# Patient Record
Sex: Female | Born: 1990 | Race: White | Hispanic: No | Marital: Single | Smoking: Former smoker
Health system: Southern US, Community
[De-identification: ages and names within clinical notes are randomized; demographics above are authoritative.]

## PROBLEM LIST (undated history)

## (undated) ENCOUNTER — Inpatient Hospital Stay (HOSPITAL_COMMUNITY): Payer: Self-pay

## (undated) DIAGNOSIS — F32A Depression, unspecified: Secondary | ICD-10-CM

## (undated) DIAGNOSIS — F419 Anxiety disorder, unspecified: Secondary | ICD-10-CM

## (undated) DIAGNOSIS — F329 Major depressive disorder, single episode, unspecified: Secondary | ICD-10-CM

## (undated) HISTORY — PX: EYE SURGERY: SHX253

## (undated) HISTORY — PX: TONSILLECTOMY: SUR1361

---
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
## 2012-05-30 ENCOUNTER — Emergency Department (HOSPITAL_COMMUNITY)
Admission: EM | Admit: 2012-05-30 | Discharge: 2012-05-30 | Disposition: A | Discharge status: Home / Self Care | Payer: 59 | Attending: Emergency Medicine | Admitting: Emergency Medicine

## 2012-05-30 ENCOUNTER — Emergency Department (HOSPITAL_COMMUNITY): Payer: 59

## 2012-05-30 ENCOUNTER — Encounter (HOSPITAL_COMMUNITY): Payer: Self-pay | Admitting: *Deleted

## 2012-05-30 DIAGNOSIS — S0990XA Unspecified injury of head, initial encounter: Secondary | ICD-10-CM

## 2012-05-30 DIAGNOSIS — W010XXA Fall on same level from slipping, tripping and stumbling without subsequent striking against object, initial encounter: Secondary | ICD-10-CM | POA: Insufficient documentation

## 2012-05-30 MED ORDER — TRAMADOL HCL 50 MG PO TABS: 50.0000 mg | ORAL_TABLET | Freq: Four times a day (QID) | ORAL | Status: DC | PRN

## 2012-05-30 NOTE — ED Provider Notes (Signed)
History     CSN: [id number]  Arrival date & time 05/30/12  0146   First MD Initiated Contact with Patient 05/30/12 0203      Chief Complaint  Patient presents with  . Fall    (Consider location/radiation/quality/duration/timing/severity/associated sxs/prior treatment) Patient is a 21 y.o. female presenting with fall. The history is provided by the patient. No language interpreter was used.  Fall The accident occurred less than 1 hour ago. The fall occurred while walking. She fell from a height of 1 to 2 ft. She landed on a hard floor. There was no blood loss. The point of impact was the head. The pain is present in the head. The pain is moderate. She was ambulatory at the scene. There was no drug use involved in the accident. There was no alcohol use involved in the accident. Pertinent negatives include no visual change, no fever, no nausea and no headaches. The symptoms are aggravated by activity. She has tried nothing for the symptoms. The treatment provided no relief.    History reviewed. No pertinent past medical history.  History reviewed. No pertinent past surgical history.  No family history on file.  History  Substance Use Topics  . Smoking status: Current Every Day Smoker  . Smokeless tobacco: Not on file  . Alcohol Use: No    OB History    Grav Para Term Preterm Abortions TAB SAB Ect Mult Living                  Review of Systems  Constitutional: Negative for fever.  Gastrointestinal: Negative for nausea.  Neurological: Negative for seizures, facial asymmetry, speech difficulty, weakness, light-headedness and headaches.  All other systems reviewed and are negative.    Allergies  Review of patient's allergies indicates not on file.  Home Medications  No current outpatient prescriptions on file.  BP 108/79  Pulse 100  Temp 98.6 F (37 C) (Oral)  Resp 18  SpO2 99%  Physical Exam  Constitutional: She is oriented to person, place, and time. She  appears well-developed and well-nourished. No distress.  HENT:  Head: Normocephalic and atraumatic.  Right Ear: No hemotympanum.  Left Ear: No hemotympanum.  Eyes: Pupils are equal, round, and reactive to light.  Neck: Normal range of motion. Neck supple.       No c spine tenderness  Cardiovascular: Normal rate and regular rhythm.   Pulmonary/Chest: Effort normal and breath sounds normal. She has no wheezes. She has no rales.  Abdominal: Soft. Bowel sounds are normal. There is no tenderness. There is no rebound and no guarding.  Musculoskeletal: Normal range of motion. She exhibits no tenderness.  Neurological: She is alert and oriented to person, place, and time. She has normal reflexes.  Skin: Skin is warm and dry.  Psychiatric: She has a normal mood and affect.    ED Course  Procedures (including critical care time)  Labs Reviewed - No data to display No results found.   No diagnosis found.    MDM  Follow up with your doctor for ongoing care        Dua Mehler K Khandi Kernes-Rasch, MD 05/30/12 [phone]
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
## 2016-07-09 ENCOUNTER — Ambulatory Visit (HOSPITAL_COMMUNITY)
Admission: EM | Admit: 2016-07-09 | Discharge: 2016-07-09 | Disposition: A | Discharge status: Home / Self Care | Payer: No Typology Code available for payment source | Source: Ambulatory Visit | Attending: Emergency Medicine | Admitting: Emergency Medicine

## 2016-07-09 ENCOUNTER — Emergency Department (HOSPITAL_COMMUNITY)
Admission: EM | Admit: 2016-07-09 | Discharge: 2016-07-10 | Disposition: A | Discharge status: Home / Self Care | Payer: 59 | Attending: Emergency Medicine | Admitting: Emergency Medicine

## 2016-07-09 ENCOUNTER — Encounter (HOSPITAL_COMMUNITY): Payer: Self-pay | Admitting: *Deleted

## 2016-07-09 DIAGNOSIS — F17200 Nicotine dependence, unspecified, uncomplicated: Secondary | ICD-10-CM | POA: Insufficient documentation

## 2016-07-09 DIAGNOSIS — T7421XA Adult sexual abuse, confirmed, initial encounter: Secondary | ICD-10-CM | POA: Diagnosis not present

## 2016-07-09 DIAGNOSIS — Z0441 Encounter for examination and observation following alleged adult rape: Secondary | ICD-10-CM | POA: Insufficient documentation

## 2016-07-09 LAB — POC URINE PREG, ED: PREG TEST UR: NEGATIVE

## 2016-07-09 MED ORDER — IBUPROFEN 200 MG PO TABS
600.0000 mg | ORAL_TABLET | Freq: Once | ORAL | Status: AC
  Administered 2016-07-09: 600 mg via ORAL
  Filled 2016-07-09: qty 1
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
## 2017-03-31 LAB — OB RESULTS CONSOLE ANTIBODY SCREEN: Antibody Screen: NEGATIVE

## 2017-03-31 LAB — OB RESULTS CONSOLE ABO/RH: RH TYPE: POSITIVE

## 2017-03-31 LAB — OB RESULTS CONSOLE RPR: RPR: NONREACTIVE

## 2017-03-31 LAB — OB RESULTS CONSOLE HEPATITIS B SURFACE ANTIGEN: HEP B S AG: NEGATIVE

## 2017-03-31 LAB — OB RESULTS CONSOLE RUBELLA ANTIBODY, IGM: Rubella: IMMUNE

## 2017-03-31 LAB — OB RESULTS CONSOLE HIV ANTIBODY (ROUTINE TESTING): HIV: NONREACTIVE

## 2017-04-22 LAB — OB RESULTS CONSOLE GC/CHLAMYDIA
CHLAMYDIA, DNA PROBE: NEGATIVE
Gonorrhea: NEGATIVE

## 2017-07-30 ENCOUNTER — Inpatient Hospital Stay (HOSPITAL_COMMUNITY)
Admission: AD | Admit: 2017-07-30 | Discharge: 2017-07-30 | Disposition: A | Discharge status: Home / Self Care | Payer: 59 | Source: Ambulatory Visit | Attending: Obstetrics & Gynecology | Admitting: Obstetrics & Gynecology

## 2017-07-30 ENCOUNTER — Encounter (HOSPITAL_COMMUNITY): Payer: Self-pay | Admitting: *Deleted

## 2017-07-30 DIAGNOSIS — Z3A25 25 weeks gestation of pregnancy: Secondary | ICD-10-CM

## 2017-07-30 DIAGNOSIS — O26892 Other specified pregnancy related conditions, second trimester: Secondary | ICD-10-CM | POA: Insufficient documentation

## 2017-07-30 DIAGNOSIS — Z87891 Personal history of nicotine dependence: Secondary | ICD-10-CM | POA: Diagnosis not present

## 2017-07-30 DIAGNOSIS — O9A212 Injury, poisoning and certain other consequences of external causes complicating pregnancy, second trimester: Secondary | ICD-10-CM | POA: Diagnosis not present

## 2017-07-30 DIAGNOSIS — T1490XA Injury, unspecified, initial encounter: Secondary | ICD-10-CM

## 2017-07-30 DIAGNOSIS — Z3689 Encounter for other specified antenatal screening: Secondary | ICD-10-CM

## 2017-07-30 DIAGNOSIS — W109XXA Fall (on) (from) unspecified stairs and steps, initial encounter: Secondary | ICD-10-CM

## 2017-07-30 DIAGNOSIS — W19XXXA Unspecified fall, initial encounter: Secondary | ICD-10-CM

## 2017-07-30 NOTE — MAU Note (Signed)
Pt reports she was walking up some stairs at school and she caught her foot and fell a forward and hit her stomach and slid down about 3 stairs. Not c/o pain at this time but OB sent her in to be evaluated.

## 2017-07-30 NOTE — MAU Provider Note (Signed)
History     CSN: [id number]  Arrival date and time: 07/30/17 1136   First Provider Initiated Contact with Patient 07/30/17 1220      Chief Complaint  Patient presents with  . Fall   G1 @25 .2 weeks here after fall. She reports falling while climbing metal stairs when her foot caught the edge. She then fell forward and the stair making contact and then she slid down a few stairs on her abdomen. Denies pain at the site. Reports good FM. No VB, LOF, or abdominal pain.   OB History    Gravida Para Term Preterm AB Living   1             SAB TAB Ectopic Multiple Live Births                  Past Medical History:  Diagnosis Date  . Medical history non-contributory     Past Surgical History:  Procedure Laterality Date  . EYE SURGERY    . TONSILLECTOMY      No family history on file.  Social History   Tobacco Use  . Smoking status: Former Games developermoker  . Smokeless tobacco: Never Used  Substance Use Topics  . Alcohol use: No  . Drug use: No    Allergies: No Known Allergies  Medications Prior to Admission  Medication Sig Dispense Refill Last Dose  . acetaminophen (TYLENOL) 500 MG tablet Take 1,000 mg by mouth every 6 (six) hours as needed for mild pain.   Past Week at Unknown time  . Prenatal Vit-Fe Fumarate-FA (PRENATAL MULTIVITAMIN) TABS tablet Take 1 tablet by mouth daily at 12 noon.   07/29/2017 at Unknown time  . sertraline (ZOLOFT) 50 MG tablet Take 50 mg by mouth daily.   07/30/2017 at Unknown time    Review of Systems  Gastrointestinal: Negative for abdominal pain.  Genitourinary: Negative for vaginal bleeding.   Physical Exam   Blood pressure 112/64, pulse 85, temperature 97.6 F (36.4 C), temperature source Oral, resp. rate 16.  Physical Exam  Nursing note and vitals reviewed. Constitutional: She is oriented to person, place, and time. She appears well-developed and well-nourished. No distress.  HENT:  Head: Normocephalic and atraumatic.  Neck: Normal  range of motion.  Cardiovascular: Normal rate.  Respiratory: Effort normal. No respiratory distress.  GI: Soft. She exhibits no distension. There is no tenderness.  gravid  Musculoskeletal: Normal range of motion.  Neurological: She is alert and oriented to person, place, and time.  Skin: Skin is warm and dry.  Psychiatric: She has a normal mood and affect.  EFM: 150 bpm, mod variability, + accels, no decels Toco: none  No results found for this or any previous visit (from the past 24 hour(s)).  MAU Course  Procedures Prolonged EFM  MDM Pt without pain or VB. FHT reassuring. Presentation, clinical findings, and plan discussed with Dr. Dion BodyVarnado and Dr. Charlotta Newtonzan. Stable for discharge home.  Assessment and Plan   1. [redacted] weeks gestation of pregnancy   2. NST (non-stress test) reactive   3. Fall, initial encounter    Discharge home Follow [facility] as scheduled PTL/abruption precautions  Allergies as of 07/30/2017   No Known Allergies     Medication List    TAKE these medications   acetaminophen 500 MG tablet Commonly known as:  TYLENOL Take 1,000 mg by mouth every 6 (six) hours as needed for mild pain.   prenatal multivitamin Tabs tablet Take 1 tablet by  mouth daily at 12 noon.   sertraline 50 MG tablet Commonly known as:  ZOLOFT Take 50 mg by mouth daily.      Donette LarryMelanie Jianni Shelden, CNM 07/30/2017, 12:29 PM

## 2017-07-30 NOTE — Discharge Instructions (Signed)
Braxton Hicks Contractions °Contractions of the uterus can occur throughout pregnancy, but they are not always a sign that you are in labor. You may have practice contractions called Braxton Hicks contractions. These false labor contractions are sometimes confused with true labor. °What are Braxton Hicks contractions? °Braxton Hicks contractions are tightening movements that occur in the muscles of the uterus before labor. Unlike true labor contractions, these contractions do not result in opening (dilation) and thinning of the cervix. Toward the end of pregnancy (32-34 weeks), Braxton Hicks contractions can happen more often and may become stronger. These contractions are sometimes difficult to tell apart from true labor because they can be very uncomfortable. You should not feel embarrassed if you go to the hospital with false labor. °Sometimes, the only way to tell if you are in true labor is for your health care provider to look for changes in the cervix. The health care provider will do a physical exam and may monitor your contractions. If you are not in true labor, the exam should show that your cervix is not dilating and your water has not broken. °If there are no prenatal problems or other health problems associated with your pregnancy, it is completely safe for you to be sent home with false labor. You may continue to have Braxton Hicks contractions until you go into true labor. °How can I tell the difference between true labor and false labor? °· Differences °? False labor °? Contractions last 30-70 seconds.: Contractions are usually shorter and not as strong as true labor contractions. °? Contractions become very regular.: Contractions are usually irregular. °? Discomfort is usually felt in the top of the uterus, and it spreads to the lower abdomen and low back.: Contractions are often felt in the front of the lower abdomen and in the groin. °? Contractions do not go away with walking.: Contractions may  go away when you walk around or change positions while lying down. °? Contractions usually become more intense and increase in frequency.: Contractions get weaker and are shorter-lasting as time goes on. °? The cervix dilates and gets thinner.: The cervix usually does not dilate or become thin. °Follow these instructions at home: °· Take over-the-counter and prescription medicines only as told by your health care provider. °· Keep up with your usual exercises and follow other instructions from your health care provider. °· Eat and drink lightly if you think you are going into labor. °· If Braxton Hicks contractions are making you uncomfortable: °? Change your position from lying down or resting to walking, or change from walking to resting. °? Sit and rest in a tub of warm water. °? Drink enough fluid to keep your urine clear or pale yellow. Dehydration may cause these contractions. °? Do slow and deep breathing several times an hour. °· Keep all follow-up prenatal visits as told by your health care provider. This is important. °Contact a health care provider if: °· You have a fever. °· You have continuous pain in your abdomen. °Get help right away if: °· Your contractions become stronger, more regular, and closer together. °· You have fluid leaking or gushing from your vagina. °· You pass blood-tinged mucus (bloody show). °· You have bleeding from your vagina. °· You have low back pain that you never had before. °· You feel your baby’s head pushing down and causing pelvic pressure. °· Your baby is not moving inside you as much as it used to. °Summary °· Contractions that occur before labor are   called Braxton Hicks contractions, false labor, or practice contractions.  Braxton Hicks contractions are usually shorter, weaker, farther apart, and less regular than true labor contractions. True labor contractions usually become progressively stronger and regular and they become more frequent.  Manage discomfort from  [facility] by changing position, resting in a warm bath, drinking plenty of water, or practicing deep breathing. This information is not intended to replace advice given to you by your health care provider. Make sure you discuss any questions you have with your health care provider. Document Released: 08/11/2005 Document Revised: 06/30/2016 Document Reviewed: 06/30/2016 Elsevier Interactive Patient Education  2017 [employer] Fall Prevention in the Home Falls can cause injuries and can affect people from all age groups. There are many simple things that you can do to make your home safe and to help prevent falls. What can I do on the outside of my home?  Regularly repair the edges of walkways and driveways and fix any cracks.  Remove high doorway thresholds.  Trim any shrubbery on the main path into your home.  Use bright outdoor lighting.  Clear walkways of debris and clutter, including tools and rocks.  Regularly check that handrails are securely fastened and in good repair. Both sides of any steps should have handrails.  Install guardrails along the edges of any raised decks or porches.  Have leaves, snow, and ice cleared regularly.  Use sand or salt on walkways during winter months.  In the garage, clean up any spills right away, including grease or oil spills. What can I do in the bathroom?  Use night lights.  Install grab bars by the toilet and in the tub and shower. Do not use towel bars as grab bars.  Use non-skid mats or decals on the floor of the tub or shower.  If you need to sit down while you are in the shower, use a plastic, non-slip stool.  Keep the floor dry. Immediately clean up any water that spills on the floor.  Remove soap buildup in the tub or shower on a regular basis.  Attach bath mats securely with double-sided non-slip rug tape.  Remove throw rugs and other tripping hazards from the floor. What can I do in the bedroom?  Use  night lights.  Make sure that a bedside light is easy to reach.  Do not use oversized bedding that drapes onto the floor.  Have a firm chair that has side arms to use for getting dressed.  Remove throw rugs and other tripping hazards from the floor. What can I do in the kitchen?  Clean up any spills right away.  Avoid walking on wet floors.  Place frequently used items in easy-to-reach places.  If you need to reach for something above you, use a sturdy step stool that has a grab bar.  Keep electrical cables out of the way.  Do not use floor polish or wax that makes floors slippery. If you have to use wax, make sure that it is non-skid floor wax.  Remove throw rugs and other tripping hazards from the floor. What can I do in the stairways?  Do not leave any items on the stairs.  Make sure that there are handrails on both sides of the stairs. Fix handrails that are broken or loose. Make sure that handrails are as long as the stairways.  Check any carpeting to make sure that it is firmly attached to the stairs. Fix any carpet that is loose or worn.  Avoid having throw rugs at the top or bottom of stairways, or secure the rugs with carpet tape to prevent them from moving.  Make sure that you have a light switch at the top of the stairs and the bottom of the stairs. If you do not have them, have them installed. What are some other fall prevention tips?  Wear closed-toe shoes that fit well and support your feet. Wear shoes that have rubber soles or low heels.  When you use a stepladder, make sure that it is completely opened and that the sides are firmly locked. Have someone hold the ladder while you are using it. Do not climb a closed stepladder.  Add color or contrast paint or tape to grab bars and handrails in your home. Place contrasting color strips on the first and last steps.  Use mobility aids as needed, such as canes, walkers, scooters, and crutches.  Turn on lights if  it is dark. Replace any light bulbs that burn out.  Set up furniture so that there are clear paths. Keep the furniture in the same spot.  Fix any uneven floor surfaces.  Choose a carpet design that does not hide the edge of steps of a stairway.  Be aware of any and all pets.  Review your medicines with your healthcare provider. Some medicines can cause dizziness or changes in blood pressure, which increase your risk of falling. Talk with your health care provider about other ways that you can decrease your risk of falls. This may include working with a physical therapist or trainer to improve your strength, balance, and endurance. This information is not intended to replace advice given to you by your health care provider. Make sure you discuss any questions you have with your health care provider. Document Released: 08/01/2002 Document Revised: 01/08/2016 Document Reviewed: 09/15/2014 Elsevier Interactive Patient Education  2017 [employer]

## 2017-07-30 NOTE — MAU Note (Signed)
Urine in lab 

## 2017-08-25 NOTE — L&D Delivery Note (Signed)
Delivery Note At  a viable female was delivered via  (Presentation:OA ;  ).  APGAR:8 9, ; weight  .   Placenta status:complete , .  Cord:3V  with the following complications CAN x1 nuchal hand .    Anesthesia:  Epidural Episiotomy:  None Lacerations:  left periurtheral Suture Repair: 4-0 vicryl Est. Blood Loss (mL):  150cc  Mom to postpartum.  Baby to Couplet care / Skin to Skin.  Henderson Newcomerancy Jean Katira Dumais 10/31/2017, 2:15 AM

## 2017-10-15 LAB — OB RESULTS CONSOLE GBS: GBS: NEGATIVE

## 2017-10-29 ENCOUNTER — Inpatient Hospital Stay (HOSPITAL_COMMUNITY)
Admission: AD | Admit: 2017-10-29 | Discharge: 2017-11-01 | DRG: 807 | Disposition: A | Discharge status: Home / Self Care | Payer: 59 | Source: Ambulatory Visit | Attending: Obstetrics & Gynecology | Admitting: Obstetrics & Gynecology

## 2017-10-29 ENCOUNTER — Encounter (HOSPITAL_COMMUNITY): Payer: Self-pay | Admitting: *Deleted

## 2017-10-29 DIAGNOSIS — O365930 Maternal care for other known or suspected poor fetal growth, third trimester, not applicable or unspecified: Secondary | ICD-10-CM | POA: Diagnosis present

## 2017-10-29 DIAGNOSIS — O9902 Anemia complicating childbirth: Secondary | ICD-10-CM | POA: Diagnosis present

## 2017-10-29 DIAGNOSIS — Z87891 Personal history of nicotine dependence: Secondary | ICD-10-CM | POA: Diagnosis not present

## 2017-10-29 DIAGNOSIS — O134 Gestational [pregnancy-induced] hypertension without significant proteinuria, complicating childbirth: Principal | ICD-10-CM | POA: Diagnosis present

## 2017-10-29 DIAGNOSIS — Z3A38 38 weeks gestation of pregnancy: Secondary | ICD-10-CM | POA: Diagnosis not present

## 2017-10-29 DIAGNOSIS — D649 Anemia, unspecified: Secondary | ICD-10-CM | POA: Diagnosis present

## 2017-10-29 DIAGNOSIS — O139 Gestational [pregnancy-induced] hypertension without significant proteinuria, unspecified trimester: Secondary | ICD-10-CM | POA: Diagnosis present

## 2017-10-29 DIAGNOSIS — O133 Gestational [pregnancy-induced] hypertension without significant proteinuria, third trimester: Secondary | ICD-10-CM

## 2017-10-29 HISTORY — DX: Major depressive disorder, single episode, unspecified: F32.9

## 2017-10-29 HISTORY — DX: Anxiety disorder, unspecified: F41.9

## 2017-10-29 HISTORY — DX: Depression, unspecified: F32.A

## 2017-10-29 LAB — COMPREHENSIVE METABOLIC PANEL
ALT: 11 U/L — ABNORMAL LOW (ref 14–54)
AST: 19 U/L (ref 15–41)
Albumin: 3.1 g/dL — ABNORMAL LOW (ref 3.5–5.0)
Alkaline Phosphatase: 207 U/L — ABNORMAL HIGH (ref 38–126)
Anion gap: 9 (ref 5–15)
BUN: 10 mg/dL (ref 6–20)
CO2: 20 mmol/L — ABNORMAL LOW (ref 22–32)
Calcium: 9.1 mg/dL (ref 8.9–10.3)
Chloride: 104 mmol/L (ref 101–111)
Creatinine, Ser: 0.63 mg/dL (ref 0.44–1.00)
GFR calc Af Amer: >60 mL/min (ref >60)
GFR calc non Af Amer: >60 mL/min (ref >60)
Glucose, Bld: 78 mg/dL (ref 65–99)
Potassium: 4 mmol/L (ref 3.5–5.1)
Sodium: 133 mmol/L — ABNORMAL LOW (ref 135–145)
Total Bilirubin: 0.4 mg/dL (ref 0.3–1.2)
Total Protein: 6.7 g/dL (ref 6.5–8.1)

## 2017-10-29 LAB — CBC
HCT: 35 % — ABNORMAL LOW (ref 36.0–46.0)
Hemoglobin: 12.1 g/dL (ref 12.0–15.0)
MCH: 30.1 pg (ref 26.0–34.0)
MCHC: 34.6 g/dL (ref 30.0–36.0)
MCV: 87.1 fL (ref 78.0–100.0)
Platelets: 193 10*3/uL (ref 150–400)
RBC: 4.02 MIL/uL (ref 3.87–5.11)
RDW: 13.4 % (ref 11.5–15.5)
WBC: 11.7 10*3/uL — ABNORMAL HIGH (ref 4.0–10.5)

## 2017-10-29 LAB — LACTATE DEHYDROGENASE: LDH: 151 U/L (ref 98–192)

## 2017-10-29 LAB — URINALYSIS, ROUTINE W REFLEX MICROSCOPIC
Bilirubin Urine: NEGATIVE
Glucose, UA: NEGATIVE mg/dL
Hgb urine dipstick: NEGATIVE
KETONES UR: NEGATIVE mg/dL
Nitrite: NEGATIVE
PROTEIN: NEGATIVE mg/dL
Specific Gravity, Urine: 1.009 (ref 1.005–1.030)
pH: 6 (ref 5.0–8.0)

## 2017-10-29 LAB — URIC ACID: Uric Acid, Serum: 5.3 mg/dL (ref 2.3–6.6)

## 2017-10-29 LAB — PROTEIN / CREATININE RATIO, URINE
Creatinine, Urine: 54 mg/dL
Protein Creatinine Ratio: 0.26 mg/mg{Cre} — ABNORMAL HIGH (ref 0.00–0.15)
Total Protein, Urine: 14 mg/dL

## 2017-10-29 MED ORDER — ONDANSETRON HCL 4 MG/2ML IJ SOLN: 4.0000 mg | Freq: Four times a day (QID) | INTRAMUSCULAR | Status: DC | PRN

## 2017-10-29 MED ORDER — OXYTOCIN BOLUS FROM INFUSION
500.0000 mL | Freq: Once | INTRAVENOUS | Status: AC
  Administered 2017-10-31: 500 mL via INTRAVENOUS

## 2017-10-29 MED ORDER — OXYCODONE-ACETAMINOPHEN 5-325 MG PO TABS: 1.0000 | ORAL_TABLET | ORAL | Status: DC | PRN

## 2017-10-29 MED ORDER — ACETAMINOPHEN 325 MG PO TABS
650.0000 mg | ORAL_TABLET | ORAL | Status: DC | PRN
  Administered 2017-10-31: 650 mg via ORAL
  Filled 2017-10-29: qty 2

## 2017-10-29 MED ORDER — LACTATED RINGERS IV SOLN
INTRAVENOUS | Status: DC
  Administered 2017-10-30 – 2017-10-31 (×3): via INTRAVENOUS

## 2017-10-29 MED ORDER — OXYTOCIN 40 UNITS IN LACTATED RINGERS INFUSION - SIMPLE MED
2.5000 [IU]/h | INTRAVENOUS | Status: DC
  Administered 2017-10-31: 2.5 [IU]/h via INTRAVENOUS

## 2017-10-29 MED ORDER — SOD CITRATE-CITRIC ACID 500-334 MG/5ML PO SOLN: 30.0000 mL | ORAL | Status: DC | PRN

## 2017-10-29 MED ORDER — HYDRALAZINE HCL 20 MG/ML IJ SOLN: 10.0000 mg | Freq: Once | INTRAMUSCULAR | Status: DC | PRN

## 2017-10-29 MED ORDER — LACTATED RINGERS IV SOLN: 500.0000 mL | INTRAVENOUS | Status: DC | PRN

## 2017-10-29 MED ORDER — LABETALOL HCL 5 MG/ML IV SOLN: 20.0000 mg | INTRAVENOUS | Status: DC | PRN

## 2017-10-29 MED ORDER — OXYCODONE-ACETAMINOPHEN 5-325 MG PO TABS: 2.0000 | ORAL_TABLET | ORAL | Status: DC | PRN

## 2017-10-29 MED ORDER — LIDOCAINE HCL (PF) 1 % IJ SOLN
30.0000 mL | INTRAMUSCULAR | Status: DC | PRN
  Filled 2017-10-29: qty 30

## 2017-10-29 NOTE — MAU Note (Signed)
PT SAYS LEFT SIDE OF FACE FEELS SWOLLEN -  STARTED Tuesday-  AND BECAME WORSE TONIGHT.     WENT TO DR TODAY- 107/72.    TONIGHT AT HOME- CHECKED BP - 145/102   AND 150/105   SO SHE CALLED  DR - TOLD  TO COME IN.    DENIES  H/A  OR EPIGASTRIC  PAIN.

## 2017-10-29 NOTE — H&P (Signed)
Kathleen Guerra is Kathleen 27 y.o. female presenting for increased blood pressures. SGA; Gestational hypertension OB History    Gravida Para Term Preterm AB Living   1             SAB TAB Ectopic Multiple Live Births                 Past Medical History:  Diagnosis Date  . Anxiety   . Depression   . Medical history non-contributory    Past Surgical History:  Procedure Laterality Date  . EYE SURGERY    . TONSILLECTOMY     Family History: family history is not on file. Social History:  reports that she quit smoking about 8 years ago. she has never used smokeless tobacco. She reports that she does not drink alcohol or use drugs.     Maternal Diabetes: No Genetic Screening:  Maternal Ultrasounds/Referrals: Normal SGA Fetal Ultrasounds or other Referrals:  None Maternal Substance Abuse:  No Significant Maternal Medications:  None Significant Maternal Lab Results:  Nonepending gbs Other Comments:    Review of Systems  All other systems reviewed and are negative.  Maternal Medical History:  Reason for admission: Gestational htn  Contractions: Frequency: regular.   Perceived severity is mild.    Fetal activity: Perceived fetal activity is normal.    Prenatal complications: PIH.   sga  Prenatal Complications - Diabetes: none.    Dilation: 1 Effacement (%): Thick Station: -1 Exam by:: Kathleen Guerra CNM Blood pressure (!) 154/93, pulse 80, temperature 98.6 F (37 C), temperature source Oral, resp. rate 20, height 5\' 8"  (1.727 m), weight 164 lb 12 oz (74.7 kg). Maternal Exam:  Uterine Assessment: Contraction strength is mild.  Contraction frequency is regular.   Abdomen: Patient reports no abdominal tenderness. Estimated fetal weight is 6 pounds.   Fetal presentation: vertex  Introitus: Normal vulva. Normal vagina.  Pelvis: adequate for delivery.   Cervix: Cervix evaluated by digital exam.     Fetal Exam Fetal Monitor Review: Mode: ultrasound.   Variability: moderate  (6-25 bpm).   Pattern: accelerations present.    Fetal State Assessment: Category I - tracings are normal.     Physical Exam  Nursing note and vitals reviewed. Constitutional: She is oriented to person, place, and time. She appears well-developed and well-nourished.  HENT:  Head: Normocephalic and atraumatic.  Cardiovascular: Normal rate and regular rhythm.  Respiratory: Effort normal and breath sounds normal.  GI: Soft.  Genitourinary: Vagina normal.  Musculoskeletal: Normal range of motion.  Neurological: She is alert and oriented to person, place, and time.  Skin: Skin is warm and dry.  Psychiatric: She has Kathleen normal mood and affect. Her behavior is normal. Thought content normal.    Prenatal labs: ABO, Rh:  O pos Antibody:  Neg Rubella:  Immune RPR:   NR HBsAg:   Neg HIV:   NR GBS:   pending Assessment/Plan: Admit for induction due to gestational htn Routine l/d orders Foley bulb Contracting too much for cytotec Planning epidural Rapid GBS Anticipate vaginal delivery   Kathleen Guerra Kathleen Guerra CNM 10/29/2017, 11:41 PM

## 2017-10-30 ENCOUNTER — Inpatient Hospital Stay (HOSPITAL_COMMUNITY): Payer: 59 | Admitting: Anesthesiology

## 2017-10-30 LAB — TYPE AND SCREEN
ABO/RH(D): O POS
Antibody Screen: NEGATIVE

## 2017-10-30 LAB — COMPREHENSIVE METABOLIC PANEL
ALBUMIN: 2.8 g/dL — AB (ref 3.5–5.0)
ALT: 10 U/L — ABNORMAL LOW (ref 14–54)
ALT: 11 U/L — ABNORMAL LOW (ref 14–54)
AST: 18 U/L (ref 15–41)
AST: 25 U/L (ref 15–41)
Albumin: 2.8 g/dL — ABNORMAL LOW (ref 3.5–5.0)
Alkaline Phosphatase: 212 U/L — ABNORMAL HIGH (ref 38–126)
Alkaline Phosphatase: 228 U/L — ABNORMAL HIGH (ref 38–126)
Anion gap: 8 (ref 5–15)
Anion gap: 8 (ref 5–15)
BILIRUBIN TOTAL: 0.4 mg/dL (ref 0.3–1.2)
BUN: 8 mg/dL (ref 6–20)
BUN: 6 mg/dL (ref 6–20)
CALCIUM: 8.4 mg/dL — AB (ref 8.9–10.3)
CO2: 19 mmol/L — ABNORMAL LOW (ref 22–32)
CO2: 19 mmol/L — ABNORMAL LOW (ref 22–32)
Calcium: 8.3 mg/dL — ABNORMAL LOW (ref 8.9–10.3)
Chloride: 106 mmol/L (ref 101–111)
Chloride: 105 mmol/L (ref 101–111)
Creatinine, Ser: 0.62 mg/dL (ref 0.44–1.00)
Creatinine, Ser: 0.65 mg/dL (ref 0.44–1.00)
GFR calc Af Amer: >60 mL/min (ref >60)
GFR calc Af Amer: >60 mL/min (ref >60)
GFR calc non Af Amer: >60 mL/min (ref >60)
GLUCOSE: 86 mg/dL (ref 65–99)
Glucose, Bld: 82 mg/dL (ref 65–99)
Potassium: 3.8 mmol/L (ref 3.5–5.1)
Potassium: 3.7 mmol/L (ref 3.5–5.1)
Sodium: 133 mmol/L — ABNORMAL LOW (ref 135–145)
Sodium: 132 mmol/L — ABNORMAL LOW (ref 135–145)
TOTAL PROTEIN: 6 g/dL — AB (ref 6.5–8.1)
Total Bilirubin: 0.6 mg/dL (ref 0.3–1.2)
Total Protein: 6.2 g/dL — ABNORMAL LOW (ref 6.5–8.1)

## 2017-10-30 LAB — CBC
HCT: 33.6 % — ABNORMAL LOW (ref 36.0–46.0)
HEMATOCRIT: 33.3 % — AB (ref 36.0–46.0)
HEMOGLOBIN: 11.7 g/dL — AB (ref 12.0–15.0)
Hemoglobin: 11.6 g/dL — ABNORMAL LOW (ref 12.0–15.0)
MCH: 29.8 pg (ref 26.0–34.0)
MCH: 30.5 pg (ref 26.0–34.0)
MCHC: 34.5 g/dL (ref 30.0–36.0)
MCHC: 35.1 g/dL (ref 30.0–36.0)
MCV: 86.4 fL (ref 78.0–100.0)
MCV: 86.9 fL (ref 78.0–100.0)
Platelets: 178 10*3/uL (ref 150–400)
Platelets: 162 10*3/uL (ref 150–400)
RBC: 3.89 MIL/uL (ref 3.87–5.11)
RBC: 3.83 MIL/uL — ABNORMAL LOW (ref 3.87–5.11)
RDW: 13.3 % (ref 11.5–15.5)
RDW: 13.4 % (ref 11.5–15.5)
WBC: 11.6 10*3/uL — ABNORMAL HIGH (ref 4.0–10.5)
WBC: 14.9 10*3/uL — AB (ref 4.0–10.5)

## 2017-10-30 LAB — ABO/RH: ABO/RH(D): O POS

## 2017-10-30 LAB — GROUP B STREP BY PCR: Group B strep by PCR: NEGATIVE

## 2017-10-30 MED ORDER — PENICILLIN G POT IN DEXTROSE 60000 UNIT/ML IV SOLN
3.0000 10*6.[IU] | INTRAVENOUS | Status: DC
  Filled 2017-10-30 (×9): qty 50

## 2017-10-30 MED ORDER — OXYTOCIN 40 UNITS IN LACTATED RINGERS INFUSION - SIMPLE MED: 1.0000 m[IU]/min | INTRAVENOUS | Status: DC

## 2017-10-30 MED ORDER — TERBUTALINE SULFATE 1 MG/ML IJ SOLN
0.2500 mg | Freq: Once | INTRAMUSCULAR | Status: DC | PRN
  Filled 2017-10-30: qty 1

## 2017-10-30 MED ORDER — LIDOCAINE HCL (PF) 1 % IJ SOLN
INTRAMUSCULAR | Status: DC | PRN
  Administered 2017-10-30 (×2): 5 mL via EPIDURAL

## 2017-10-30 MED ORDER — FENTANYL 2.5 MCG/ML BUPIVACAINE 1/10 % EPIDURAL INFUSION (WH - ANES)
14.0000 mL/h | INTRAMUSCULAR | Status: DC | PRN
  Administered 2017-10-30: 14 mL/h via EPIDURAL
  Filled 2017-10-30: qty 100

## 2017-10-30 MED ORDER — SODIUM CHLORIDE 0.9 % IV SOLN
5.0000 10*6.[IU] | Freq: Once | INTRAVENOUS | Status: DC
  Filled 2017-10-30: qty 5

## 2017-10-30 MED ORDER — DIPHENHYDRAMINE HCL 50 MG/ML IJ SOLN: 12.5000 mg | INTRAMUSCULAR | Status: DC | PRN

## 2017-10-30 MED ORDER — FENTANYL CITRATE (PF) 100 MCG/2ML IJ SOLN
50.0000 ug | INTRAMUSCULAR | Status: DC | PRN
  Administered 2017-10-30 (×2): 50 ug via INTRAVENOUS
  Filled 2017-10-30 (×2): qty 2

## 2017-10-30 MED ORDER — EPHEDRINE 5 MG/ML INJ
10.0000 mg | INTRAVENOUS | Status: DC | PRN
  Filled 2017-10-30: qty 2

## 2017-10-30 MED ORDER — PHENYLEPHRINE 40 MCG/ML (10ML) SYRINGE FOR IV PUSH (FOR BLOOD PRESSURE SUPPORT)
80.0000 ug | PREFILLED_SYRINGE | INTRAVENOUS | Status: DC | PRN
  Filled 2017-10-30: qty 10
  Filled 2017-10-30: qty 5

## 2017-10-30 MED ORDER — LACTATED RINGERS IV SOLN: 500.0000 mL | Freq: Once | INTRAVENOUS | Status: DC

## 2017-10-30 MED ORDER — PHENYLEPHRINE 40 MCG/ML (10ML) SYRINGE FOR IV PUSH (FOR BLOOD PRESSURE SUPPORT)
80.0000 ug | PREFILLED_SYRINGE | INTRAVENOUS | Status: DC | PRN
  Filled 2017-10-30: qty 5

## 2017-10-30 MED ORDER — OXYTOCIN 40 UNITS IN LACTATED RINGERS INFUSION - SIMPLE MED
1.0000 m[IU]/min | INTRAVENOUS | Status: DC
  Administered 2017-10-30: 1 m[IU]/min via INTRAVENOUS
  Filled 2017-10-30: qty 1000

## 2017-10-30 NOTE — Progress Notes (Signed)
Kathleen Guerra is a 27 y.o. G1P0 at 6671w3d   Subjective: Pt without complaints.  Denies headaches, visual changes, upper abdominal pain.  Pt agreeable to attempt foley bulb placement again.  Objective: BP 134/78   Pulse 77   Temp 98.6 F (37 C) (Oral)   Resp 18   Ht 5\' 8"  (1.727 m)   Wt 74.7 kg (164 lb 12 oz)   BMI 25.05 kg/m  No intake/output data recorded. No intake/output data recorded.  FHT:  FHR: 140s bpm, variability: moderate,  accelerations:  Present,  decelerations:  Absent UC:   regular, every 2-3 minutes SVE:   Dilation: 1 Effacement (%): 50 Station: -2, Ballotable Exam by:: Dr Levora AngelVanardo   Foley bulb placed manually.  Well applied to internal os. Labs: Lab Results  Component Value Date   WBC 11.6 (H) 10/30/2017   HGB 11.6 (L) 10/30/2017   HCT 33.6 (L) 10/30/2017   MCV 86.4 10/30/2017   PLT 178 10/30/2017    Assessment / Plan: IUP @ 38 3/7 weeks.  Labor: Foley bulb placement for cervical ripening.  Continue Low dose Pitocin.  BP mildly elevated. Preeclampsia:  no signs or symptoms of toxicity and labs stable Fetal Wellbeing:  Category I Pain Control:  No pain I/D:  n/a Anticipated MOD:  NSVD  Kathleen Guerra 10/30/2017, 8:24 AM

## 2017-10-30 NOTE — Anesthesia Pain Management Evaluation Note (Signed)
  CRNA Pain Management Visit Note  Patient: Kathleen Guerra, 27 y.o., female  "Hello I am a member of the anesthesia team at [facility]. We have an anesthesia team available at all times to provide care throughout the hospital, including epidural management and anesthesia for C-section. I don't know your plan for the delivery whether it a natural birth, water birth, IV sedation, nitrous supplementation, doula or epidural, but we want to meet your pain goals."   1.Was your pain managed to your expectations on prior hospitalizations?   No prior hospitalizations  2.What is your expectation for pain management during this hospitalization?     Epidural  3.How can we help you reach that goal? epidural  Record the patient's initial score and the patient's pain goal.   Pain: 2  Pain Goal: 6 [facility] wants you to be able to say your pain was always managed very well.  [name] 10/30/2017

## 2017-10-30 NOTE — Progress Notes (Addendum)
Kathleen Guerra is a 27 y.o. G1P0 at 2930w3d   Subjective: Pt still feels contractions but not as intensely now that Foley bulb is out.  Objective: BP 131/81   Pulse 79   Temp 98.6 F (37 C) (Oral)   Resp 16   Ht 5\' 8"  (1.727 m)   Wt 74.7 kg (164 lb 12 oz)   BMI 25.05 kg/m  No intake/output data recorded. No intake/output data recorded.  FHT:  140s, moderate variability, +accelerations, no decelerations. UC:   Irregular, palpate firm. SVE:   Dilation: 5 Effacement (%): 60 Station: -2 Exam by:: Dr Dion BodyVarnado   Labs: Lab Results  Component Value Date   WBC 11.6 (H) 10/30/2017   HGB 11.6 (L) 10/30/2017   HCT 33.6 (L) 10/30/2017   MCV 86.4 10/30/2017   PLT 178 10/30/2017   CMP     Component Value Date/Time   NA 133 (L) 10/30/2017 0400   K 3.8 10/30/2017 0400   CL 106 10/30/2017 0400   CO2 19 (L) 10/30/2017 0400   GLUCOSE 82 10/30/2017 0400   BUN 8 10/30/2017 0400   CREATININE 0.62 10/30/2017 0400   CALCIUM 8.3 (L) 10/30/2017 0400   PROT 6.2 (L) 10/30/2017 0400   ALBUMIN 2.8 (L) 10/30/2017 0400   AST 18 10/30/2017 0400   ALT 10 (L) 10/30/2017 0400   ALKPHOS 212 (H) 10/30/2017 0400   BILITOT 0.6 10/30/2017 0400   GFRNONAA >60 10/30/2017 0400   GFRAA >60 10/30/2017 0400   PCR 0.26  Assessment / Plan: IUP @ 38 3/[redacted] weeks  Gestational HTN-BPs now normal.  Labor: S/p AROM, continue increasing Pitocin 2 x 2. Preeclampsia:  BP normal.  Repeat labs, CMP, CBC Fetal Wellbeing:  Category I Pain Control:  Epidural prn I/D:  n/a Anticipated MOD:  NSVD   CCOB covering after 7.  Pt aware.  Kathleen Guerra 10/30/2017, 6:34 PM

## 2017-10-30 NOTE — Anesthesia Preprocedure Evaluation (Signed)
Anesthesia Evaluation  Patient identified by MRN, date of birth, ID band Patient awake    Reviewed: Allergy & Precautions, H&P , NPO status , Patient's Chart, lab work & pertinent test results  History of Anesthesia Complications Negative for: history of anesthetic complications  Airway Mallampati: II  TM Distance: >3 FB Neck ROM: full    Dental no notable dental hx. (+) Teeth Intact   Pulmonary former smoker,    Pulmonary exam normal breath sounds clear to auscultation       Cardiovascular negative cardio ROS Normal cardiovascular exam Rhythm:regular Rate:Normal     Neuro/Psych PSYCHIATRIC DISORDERS Anxiety Depression negative neurological ROS     GI/Hepatic negative GI ROS, Neg liver ROS,   Endo/Other  negative endocrine ROS  Renal/GU negative Renal ROS  negative genitourinary   Musculoskeletal   Abdominal   Peds  Hematology  (+) anemia ,   Anesthesia Other Findings   Reproductive/Obstetrics (+) Pregnancy                             Anesthesia Physical Anesthesia Plan  ASA: II  Anesthesia Plan: Epidural   Post-op Pain Management:    Induction:   PONV Risk Score and Plan:   Airway Management Planned:   Additional Equipment:   Intra-op Plan:   Post-operative Plan:   Informed Consent: I have reviewed the patients History and Physical, chart, labs and discussed the procedure including the risks, benefits and alternatives for the proposed anesthesia with the patient or authorized representative who has indicated his/her understanding and acceptance.     Plan Discussed with:   Anesthesia Plan Comments:         Anesthesia Quick Evaluation

## 2017-10-30 NOTE — Progress Notes (Signed)
Having more painful contractions.   BP 124/87   Pulse 82   Temp 98.4 F (36.9 C) (Oral)   Resp 16   Ht 5\' 8"  (1.727 m)   Wt 74.7 kg (164 lb 12 oz)   BMI 25.05 kg/m '  Temp:  [98.4 F (36.9 C)-98.6 F (37 C)] 98.4 F (36.9 C) (03/08 1006) Pulse Rate:  [72-106] 82 (03/08 1206) Resp:  [16-20] 16 (03/08 1006) BP: (122-159)/(75-108) 124/87 (03/08 1206) Weight:  [74.7 kg (164 lb 12 oz)] 74.7 kg (164 lb 12 oz) (03/07 2144)   Gen:  NAD Cervix: deferred.  Cat 1 tracing, irregular contractions.  Continue current management.  Increase Pitocin 2 x2 mUs once Foley bulb expelled.

## 2017-10-30 NOTE — Progress Notes (Signed)
Attempted Foley Bulb placement x 3 with speculum. Wills start lose dose pitocin for cervical ripening.

## 2017-10-30 NOTE — Anesthesia Procedure Notes (Signed)
Epidural Patient location during procedure: OB Start time: 10/30/2017 8:57 PM  Staffing Anesthesiologist: Leonides GrillsEllender, Ryan P, MD Performed: anesthesiologist   Preanesthetic Checklist Completed: patient identified, site marked, pre-op evaluation, timeout performed, IV checked, risks and benefits discussed and monitors and equipment checked  Epidural Patient position: sitting Prep: DuraPrep Patient monitoring: heart rate, cardiac monitor, continuous pulse ox and blood pressure Approach: midline Location: L4-L5 Injection technique: LOR air  Needle:  Needle type: Tuohy  Needle gauge: 17 G Needle length: 9 cm Needle insertion depth: 4 cm Catheter type: closed end flexible Catheter size: 19 Gauge Catheter at skin depth: 9 cm Test dose: negative and Other  Assessment Events: blood not aspirated, injection not painful, no injection resistance and negative IV test  Additional Notes Informed consent obtained prior to proceeding including risk of failure, 1% risk of PDPH, risk of minor discomfort and bruising. Discussed alternatives to epidural analgesia and patient desires to proceed.  Timeout performed pre-procedure verifying patient name, procedure, and platelet count.  Patient tolerated procedure well. Reason for block:procedure for pain

## 2017-10-30 NOTE — Progress Notes (Signed)
Subjective: Comfortable with epidural.  RN called for evaluation due to pitocin at 20 mu  Objective: BP 128/88   Pulse 80   Temp 98.1 F (36.7 C) (Oral)   Resp 18   Ht 5\' 8"  (1.727 m)   Wt 74.7 kg (164 lb 12 oz)   SpO2 97%   BMI 25.05 kg/m  No intake/output data recorded. No intake/output data recorded.  FHT: Category 1 UC:   3 in 10 minutes SVE:   Dilation: 6 Effacement (%): 80 Station: -2 Exam by:: Prothero, CNM Pitocin at 20 mu  IUPC discussed with pt and husband.  Placed without difficulty.  Bloody show noted.  Assessment:  27 yo G1 P0 at 38.3 IUP gestational hypertension Cat 1 strip Plan: Monitor progress Anticipate SVD  Kenney HousemanNancy Jean Prothero CNM, MSN 10/30/2017, 10:47 PM

## 2017-10-31 LAB — RPR: RPR Ser Ql: NONREACTIVE

## 2017-10-31 LAB — CBC
HCT: 28.6 % — ABNORMAL LOW (ref 36.0–46.0)
Hemoglobin: 10.1 g/dL — ABNORMAL LOW (ref 12.0–15.0)
MCH: 30.7 pg (ref 26.0–34.0)
MCHC: 35.3 g/dL (ref 30.0–36.0)
MCV: 86.9 fL (ref 78.0–100.0)
PLATELETS: 136 10*3/uL — AB (ref 150–400)
RBC: 3.29 MIL/uL — AB (ref 3.87–5.11)
RDW: 13.4 % (ref 11.5–15.5)
WBC: 14.4 10*3/uL — AB (ref 4.0–10.5)

## 2017-10-31 MED ORDER — PRENATAL MULTIVITAMIN CH
1.0000 | ORAL_TABLET | Freq: Every day | ORAL | Status: DC
  Administered 2017-10-31 – 2017-11-01 (×2): 1 via ORAL
  Filled 2017-10-31 (×2): qty 1

## 2017-10-31 MED ORDER — SIMETHICONE 80 MG PO CHEW: 80.0000 mg | CHEWABLE_TABLET | ORAL | Status: DC | PRN

## 2017-10-31 MED ORDER — IBUPROFEN 600 MG PO TABS
600.0000 mg | ORAL_TABLET | Freq: Four times a day (QID) | ORAL | Status: DC
  Administered 2017-10-31 – 2017-11-01 (×6): 600 mg via ORAL
  Filled 2017-10-31 (×6): qty 1

## 2017-10-31 MED ORDER — BENZOCAINE-MENTHOL 20-0.5 % EX AERO: 1.0000 "application " | INHALATION_SPRAY | CUTANEOUS | Status: DC | PRN

## 2017-10-31 MED ORDER — WITCH HAZEL-GLYCERIN EX PADS: 1.0000 "application " | MEDICATED_PAD | CUTANEOUS | Status: DC | PRN

## 2017-10-31 MED ORDER — DIPHENHYDRAMINE HCL 25 MG PO CAPS: 25.0000 mg | ORAL_CAPSULE | Freq: Four times a day (QID) | ORAL | Status: DC | PRN

## 2017-10-31 MED ORDER — TETANUS-DIPHTH-ACELL PERTUSSIS 5-2.5-18.5 LF-MCG/0.5 IM SUSP: 0.5000 mL | Freq: Once | INTRAMUSCULAR | Status: DC

## 2017-10-31 MED ORDER — ACETAMINOPHEN 325 MG PO TABS: 650.0000 mg | ORAL_TABLET | ORAL | Status: DC | PRN

## 2017-10-31 MED ORDER — ONDANSETRON HCL 4 MG/2ML IJ SOLN: 4.0000 mg | INTRAMUSCULAR | Status: DC | PRN

## 2017-10-31 MED ORDER — COCONUT OIL OIL: 1.0000 "application " | TOPICAL_OIL | Status: DC | PRN

## 2017-10-31 MED ORDER — SENNOSIDES-DOCUSATE SODIUM 8.6-50 MG PO TABS
2.0000 | ORAL_TABLET | ORAL | Status: DC
  Administered 2017-10-31: 2 via ORAL
  Filled 2017-10-31: qty 2

## 2017-10-31 MED ORDER — SERTRALINE HCL 50 MG PO TABS
50.0000 mg | ORAL_TABLET | Freq: Every day | ORAL | Status: DC
  Administered 2017-10-31 – 2017-11-01 (×2): 50 mg via ORAL
  Filled 2017-10-31 (×3): qty 1

## 2017-10-31 MED ORDER — ZOLPIDEM TARTRATE 5 MG PO TABS: 5.0000 mg | ORAL_TABLET | Freq: Every evening | ORAL | Status: DC | PRN

## 2017-10-31 MED ORDER — DIBUCAINE 1 % RE OINT: 1.0000 "application " | TOPICAL_OINTMENT | RECTAL | Status: DC | PRN

## 2017-10-31 MED ORDER — ONDANSETRON HCL 4 MG PO TABS
4.0000 mg | ORAL_TABLET | ORAL | Status: DC | PRN
Start: 2017-10-31 — End: 2017-11-01

## 2017-10-31 NOTE — Anesthesia Postprocedure Evaluation (Signed)
Anesthesia Post Note  Patient: Kathleen Guerra  Procedure(s) Performed: AN AD HOC LABOR EPIDURAL     Patient location during evaluation: Mother Baby Anesthesia Type: Epidural Level of consciousness: awake and alert and oriented Pain management: satisfactory to patient Vital Signs Assessment: post-procedure vital signs reviewed and stable Respiratory status: respiratory function stable Cardiovascular status: stable Postop Assessment: no headache, no backache, epidural receding, patient able to bend at knees, no signs of nausea or vomiting and adequate PO intake Anesthetic complications: no    Last Vitals:  Vitals:   10/31/17 0438 10/31/17 0652  BP: 131/82 137/86  Pulse: 85 71  Resp: 18 18  Temp: 36.6 C 36.9 C  SpO2:  98%    Last Pain:  Vitals:   10/31/17 0652  TempSrc: Oral  PainSc: 0-No pain   Pain Goal:                 Lavonna Lampron

## 2017-10-31 NOTE — Plan of Care (Signed)
Patient fell and hit her elbow on  bar  by toliet this AM, patient vitals WNL, no c/o pain on my shift, plan is to continue to monitor for 24 hours and assist with ambulation.

## 2017-10-31 NOTE — Progress Notes (Signed)
S: Called by nurse because pt fell in bathroom and hit elbow.  Pt left leg still slightly numb and gave out on her.  PT slide down wall and hit elbow lightly on wall.  Pt denies any pain.  Pt denies dizziness. O: VSS wnl Elbow inspected with no bruising noted. Fundus firm lochia wnl A: Fall P:  Have help getting up to BR until no further numbness in left leg.

## 2017-10-31 NOTE — Progress Notes (Signed)
   10/31/17 [id number]  What Happened  Was fall witnessed? Yes  Who witnessed fall? Andee PolesLindsey Anaiz Qazi RN  Patients activity before fall bathroom-assisted  Point of contact buttocks  Was patient injured? No  Follow Up  MD notified Yes  Time MD notified [phone]  Family notified (family at bedside)  Additional tests No  Progress note created (see row info) Yes  Adult Fall Risk Assessment  Risk Factor Category (scoring not indicated) Fall has occurred during this admission (document High fall risk)  Age 27  Fall History: Fall within 6 months prior to admission 5  Elimination; Bowel and/or Urine Incontinence 0  Elimination; Bowel and/or Urine Urgency/Frequency 0  Medications: includes PCA/Opiates, Anti-convulsants, Anti-hypertensives, Diuretics, Hypnotics, Laxatives, Sedatives, and Psychotropics 0  Patient Care Equipment 0  Mobility-Assistance 2  Mobility-Gait 2  Mobility-Sensory Deficit 0  Altered awareness of immediate physical environment 0  Impulsiveness 0  Lack of understanding of one's physical/cognitive limitations 0  Total Score 9  Patient's Fall Risk High Fall Risk (>13 points)  Adult Fall Risk Interventions  Required Bundle Interventions *See Row Information* High fall risk - low, moderate, and high requirements implemented  Additional Interventions Family Supervision  Screening for Fall Injury Risk  Risk For Fall Injury- See Row Information  None identified  Vitals  Temp 98.5 F (36.9 C)  Temp Source Oral  BP 137/86  BP Location Right Arm  BP Method Automatic  Patient Position (if appropriate) Semi-fowlers  Pulse Rate 71  Pulse Rate Source Dinamap  Resp 18  Oxygen Therapy  SpO2 98 %  O2 Device Room Air  Pain Assessment  Pain Assessment No/denies pain  Pain Score 0  Neurological  Neuro (WDL) WDL  Musculoskeletal  Musculoskeletal (WDL) WDL  Integumentary  Integumentary (WDL) WDL    Pt stated that she needed to use the restroom. Pt had mobility/sensation in both  legs, however the left felt "a little heavy". I assisted her by holding her arm while walking to the restroom. I helped situate pt over the commode, and as she went to sit down her pads fell out of her panties and fell into the toilet. She was midway through sitting when this happened, and tried to go from half squatting to standing. In the process of standing back up she slid down the wall and hit her elbow on the metal assistance rail and landed on her buttocks. I assisted her back to standing, and pt stated she was ok and did not hit her back or head on the rail. She said she did not hit her elbow very hard and did not feel it was injured. I assisted her back onto the toilet, assisted with pericare, and then assisted her back to bed. I performed a head to toe assessment and vitals, all WNL. Charge nurse, provider, and [facility] notified and post huddle completed at bedside.

## 2017-11-01 ENCOUNTER — Encounter (HOSPITAL_COMMUNITY): Payer: Self-pay | Admitting: *Deleted

## 2017-11-01 MED ORDER — IBUPROFEN 600 MG PO TABS: 600.0000 mg | ORAL_TABLET | Freq: Four times a day (QID) | ORAL | 0 refills | Status: DC

## 2017-11-01 NOTE — Discharge Summary (Signed)
OB Discharge Summary     Patient Name: Kathleen Guerra DOB: Sep 15, 1990 MRN: [id number]  Date of admission: 10/29/2017 Delivering MD: Kenney HousemanPROTHERO,  JEAN   Date of discharge: 11/01/2017  Admitting diagnosis: 38 weeks, abnormal face swelling, high blood pressure Intrauterine pregnancy: 7335w5d     Secondary diagnosis:  Active Problems:   Gestational hypertension   SVD (spontaneous vaginal delivery)  Additional problems:None     Discharge diagnosis: Term Pregnancy Delivered                                                                                                Post partum procedures:None  Augmentation: AROM and Pitocin  Complications: None  Hospital course:  Induction of Labor With Vaginal Delivery   27 y.o. yo G1P0 at 7635w5d was admitted to the hospital 10/29/2017 for induction of labor.  Indication for induction: Gestational hypertension.  Patient had an uncomplicated labor course as follows: Membrane Rupture Time/Date: 6:24 PM ,10/30/2017   Intrapartum Procedures: Episiotomy: None [1]                                         Lacerations:  Periurethral [8]  Patient had delivery of a Viable infant.  Information for the patient's newborn:  Sandi RavelingRobbins, Girl Jermya [[id number]][[id number]]      10/31/2017  Details of delivery can be found in separate delivery note.  Patient had a routine postpartum course. Patient is discharged home 11/01/17.  Physical exam  Vitals:   10/31/17 1300 10/31/17 1444 10/31/17 1804 10/31/17 1900  BP: 130/78 119/71 137/81 130/78  Pulse: 66 91 88 79  Resp: 20 20 18 20   Temp:  98.3 F (36.8 C) 98.2 F (36.8 C) (!) 97 F (36.1 C)  TempSrc:  Oral Oral Oral  SpO2:   97%   Weight:      Height:       General: alert, cooperative and no distress Lochia: appropriate Uterine Fundus: firm Incision: N/A DVT Evaluation: No evidence of DVT seen on physical exam. Negative Homan's sign. Labs: Lab Results  Component Value Date   WBC 14.4 (H) 10/31/2017   HGB 10.1  (L) 10/31/2017   HCT 28.6 (L) 10/31/2017   MCV 86.9 10/31/2017   PLT 136 (L) 10/31/2017   CMP Latest Ref Rng & Units 10/30/2017  Glucose 65 - 99 mg/dL 86  BUN 6 - 20 mg/dL 6  Creatinine 0.860.44 - 5.781.00 mg/dL 4.690.65  Sodium 629135 - 528145 mmol/L 132(L)  Potassium 3.5 - 5.1 mmol/L 3.7  Chloride 101 - 111 mmol/L 105  CO2 22 - 32 mmol/L 19(L)  Calcium 8.9 - 10.3 mg/dL 4.1(L8.4(L)  Total Protein 6.5 - 8.1 g/dL 6.0(L)  Total Bilirubin 0.3 - 1.2 mg/dL 0.4  Alkaline Phos 38 - 126 U/L 228(H)  AST 15 - 41 U/L 25  ALT 14 - 54 U/L 11(L)    Discharge instruction: per After Visit Summary and "Baby and Me Booklet".  After visit meds:  PNV, Ibuprofen  Diet: routine diet  Activity: Advance as tolerated. Pelvic rest for 6 weeks.   Outpatient follow up:6 weeks Follow up Appt:No future appointments. Follow up Visit:No Follow-up on file.  Postpartum contraception: Combination OCPs  Newborn Data: Live born female  Birth Weight: 6 lb 12.5 oz (3076 g) APGAR: 8, 9  Newborn Delivery   Birth date/time:  10/31/2017 01:54:00 Delivery type:  Vaginal, Spontaneous     Baby Feeding: Bottle Disposition:home with mother   11/01/2017 Kenney Houseman, CNM

## 2017-11-01 NOTE — Progress Notes (Signed)
MOB was referred for history of depression/anxiety. * Referral screened out by Clinical Social Worker because none of the following criteria appear to apply: ~ History of anxiety/depression during this pregnancy, or of post-partum depression. ~ Diagnosis of anxiety and/or depression within last 3 years OR * MOB's symptoms currently being treated with medication and/or therapy. Please contact the Clinical Social Worker if needs arise or by [facility].   Patient's recently scored a 7 on her EDPS. Patient has prescription for Zoloft.  Edwin Dadaarol Maurie Musco, MSW, LCSW-A Clinical Social Worker [facility] [phone]

## 2018-03-13 ENCOUNTER — Encounter (HOSPITAL_COMMUNITY): Payer: Self-pay | Admitting: *Deleted

## 2018-03-13 ENCOUNTER — Other Ambulatory Visit: Payer: Self-pay

## 2018-03-13 ENCOUNTER — Ambulatory Visit (HOSPITAL_COMMUNITY)
Admission: EM | Admit: 2018-03-13 | Discharge: 2018-03-13 | Disposition: A | Discharge status: Home / Self Care | Payer: 59 | Attending: Family Medicine | Admitting: Family Medicine

## 2018-03-13 DIAGNOSIS — R197 Diarrhea, unspecified: Secondary | ICD-10-CM

## 2018-03-13 DIAGNOSIS — R112 Nausea with vomiting, unspecified: Secondary | ICD-10-CM

## 2018-03-13 MED ORDER — ONDANSETRON 4 MG PO TBDP: 4.0000 mg | ORAL_TABLET | Freq: Three times a day (TID) | ORAL | 0 refills | Status: DC | PRN

## 2018-03-13 MED ORDER — DICYCLOMINE HCL 20 MG PO TABS: 20.0000 mg | ORAL_TABLET | Freq: Two times a day (BID) | ORAL | 0 refills | Status: DC

## 2018-03-13 NOTE — ED Triage Notes (Signed)
Reports starting with n/v/d last night after eating chili dogs.  States sxs are improving, but needs work excuse for [employer].

## 2018-03-13 NOTE — Discharge Instructions (Signed)
Zofran for nausea and vomiting as needed. Keep hydrated, you urine should be clear to pale yellow in color. Bland diet, advance as tolerated. Monitor for any worsening of symptoms, nausea or vomiting not controlled by medication, worsening abdominal pain, fever, follow-up for reevaluation. °

## 2018-03-13 NOTE — ED Provider Notes (Signed)
[facility]    CSN: [id number] Arrival date & time: 03/13/18  1709     History   Chief Complaint Chief Complaint  Patient presents with  . Emesis  . Diarrhea    HPI Kathleen Guerra is a 27 y.o. female.   27 year old female comes in for 2-day history of nausea, vomiting, diarrhea.  Patient states that this started after eating chili dogs.,  Has had 4 episodes of nonbilious nonbloody vomit and 2 episodes of watery diarrhea.  Denies melena, hematochezia.  States is slightly improving, but work requires a work note.  Denies fever, chills, night sweats.  Has had some abdominal cramping that is worse with movement, relieved by laying down.  Denies URI symptoms such as cough, congestion, sore throat.  Denies sick contact.     Past Medical History:  Diagnosis Date  . Anxiety   . Depression     Patient Active Problem List   Diagnosis Date Noted  . SVD (spontaneous vaginal delivery) 10/31/2017  . Gestational hypertension 10/29/2017    Past Surgical History:  Procedure Laterality Date  . EYE SURGERY    . TONSILLECTOMY      OB History    Gravida  1   Para      Term      Preterm      AB      Living        SAB      TAB      Ectopic      Multiple      Live Births               Home Medications    Prior to Admission medications   Medication Sig Start Date End Date Taking? Authorizing Provider  sertraline (ZOLOFT) 50 MG tablet Take 100 mg by mouth daily.    Yes [provider]  dicyclomine (BENTYL) 20 MG tablet Take 1 tablet (20 mg total) by mouth 2 (two) times daily. 03/13/18   Cathie HoopsYu, Amy V, PA-C  ondansetron (ZOFRAN ODT) 4 MG disintegrating tablet Take 1 tablet (4 mg total) by mouth every 8 (eight) hours as needed for nausea or vomiting. 03/13/18   Belinda FisherYu, Amy V, PA-C    Family History Family History  Problem Relation Age of Onset  . Clotting disorder Father     Social History Social History   Tobacco Use  . Smoking status:  Former Smoker    Last attempt to quit: 10/29/2009    Years since quitting: 8.3  . Smokeless tobacco: Never Used  Substance Use Topics  . Alcohol use: Yes    Comment: socially  . Drug use: Not Currently     Allergies   Patient has no known allergies.   Review of Systems Review of Systems  Reason unable to perform ROS: See HPI as above.     Physical Exam Triage Vital Signs ED Triage Vitals  Enc Vitals Group     BP 03/13/18 1802 107/79     Pulse Rate 03/13/18 1802 64     Resp 03/13/18 1802 16     Temp 03/13/18 1802 98.1 F (36.7 C)     Temp Source 03/13/18 1802 Oral     SpO2 03/13/18 1802 99 %     Weight --      Height --      Head Circumference --      Peak Flow --      Pain Score 03/13/18 1803 0  Pain Loc --      Pain Edu? --      Excl. in GC? --    No data found.  Updated Vital Signs BP 107/79   Pulse 64   Temp 98.1 F (36.7 C) (Oral)   Resp 16   SpO2 99%   Breastfeeding? No   Physical Exam  Constitutional: She is oriented to person, place, and time. She appears well-developed and well-nourished. No distress.  HENT:  Head: Normocephalic and atraumatic.  Eyes: Pupils are equal, round, and reactive to light. Conjunctivae are normal.  Cardiovascular: Normal rate, regular rhythm and normal heart sounds. Exam reveals no gallop and no friction rub.  No murmur heard. Pulmonary/Chest: Effort normal and breath sounds normal. She has no wheezes. She has no rales.  Abdominal: Soft. Bowel sounds are normal. She exhibits no mass. There is no rebound, no guarding and no CVA tenderness.  Mild tenderness to palpation of the abdomen that is distractible.   Neurological: She is alert and oriented to person, place, and time.  Skin: Skin is warm and dry.  Psychiatric: She has a normal mood and affect. Her behavior is normal. Judgment normal.    UC Treatments / Results  Labs (all labs ordered are listed, but only abnormal results are displayed) Labs Reviewed - No  data to display  EKG None  Radiology No results found.  Procedures Procedures (including critical care time)  Medications Ordered in UC Medications - No data to display  Initial Impression / Assessment and Plan / UC Course  I have reviewed the triage vital signs and the nursing notes.  Pertinent labs & imaging results that were available during my care of the patient were reviewed by me and considered in my medical decision making (see chart for details).    Discussed with patient no alarming signs on exam. Zofran for nausea.  Bentyl for abdominal cramping.  Push fluids. Bland diet, advance as tolerated. Return precautions given.  Final Clinical Impressions(s) / UC Diagnoses   Final diagnoses:  Nausea vomiting and diarrhea    ED Prescriptions    Medication Sig Dispense Auth. Provider   ondansetron (ZOFRAN ODT) 4 MG disintegrating tablet Take 1 tablet (4 mg total) by mouth every 8 (eight) hours as needed for nausea or vomiting. 20 tablet Yu, Amy V, PA-C   dicyclomine (BENTYL) 20 MG tablet Take 1 tablet (20 mg total) by mouth 2 (two) times daily. 20 tablet Threasa Alpha, [place] 03/13/18 [phone]

## 2018-03-14 ENCOUNTER — Emergency Department (HOSPITAL_COMMUNITY): Payer: 59

## 2018-03-14 ENCOUNTER — Encounter (HOSPITAL_COMMUNITY): Payer: Self-pay | Admitting: *Deleted

## 2018-03-14 ENCOUNTER — Other Ambulatory Visit: Payer: Self-pay

## 2018-03-14 ENCOUNTER — Emergency Department (HOSPITAL_COMMUNITY)
Admission: EM | Admit: 2018-03-14 | Discharge: 2018-03-14 | Disposition: A | Discharge status: Home / Self Care | Payer: 59 | Attending: Emergency Medicine | Admitting: Emergency Medicine

## 2018-03-14 DIAGNOSIS — Z79899 Other long term (current) drug therapy: Secondary | ICD-10-CM | POA: Insufficient documentation

## 2018-03-14 DIAGNOSIS — W25XXXA Contact with sharp glass, initial encounter: Secondary | ICD-10-CM | POA: Diagnosis not present

## 2018-03-14 DIAGNOSIS — Z87891 Personal history of nicotine dependence: Secondary | ICD-10-CM | POA: Diagnosis not present

## 2018-03-14 DIAGNOSIS — Y999 Unspecified external cause status: Secondary | ICD-10-CM | POA: Insufficient documentation

## 2018-03-14 DIAGNOSIS — S91114A Laceration without foreign body of right lesser toe(s) without damage to nail, initial encounter: Secondary | ICD-10-CM | POA: Diagnosis not present

## 2018-03-14 DIAGNOSIS — S99921A Unspecified injury of right foot, initial encounter: Secondary | ICD-10-CM | POA: Diagnosis present

## 2018-03-14 DIAGNOSIS — Y93E9 Activity, other interior property and clothing maintenance: Secondary | ICD-10-CM | POA: Diagnosis not present

## 2018-03-14 DIAGNOSIS — Y929 Unspecified place or not applicable: Secondary | ICD-10-CM | POA: Diagnosis not present

## 2018-03-14 MED ORDER — BACITRACIN ZINC 500 UNIT/GM EX OINT
TOPICAL_OINTMENT | Freq: Two times a day (BID) | CUTANEOUS | Status: DC
  Administered 2018-03-14: 1 via TOPICAL
  Filled 2018-03-14: qty 0.9

## 2018-03-14 MED ORDER — ACETAMINOPHEN 325 MG PO TABS
650.0000 mg | ORAL_TABLET | Freq: Once | ORAL | Status: AC
  Administered 2018-03-14: 650 mg via ORAL
  Filled 2018-03-14: qty 2

## 2018-03-14 MED ORDER — LIDOCAINE HCL (PF) 1 % IJ SOLN
5.0000 mL | Freq: Once | INTRAMUSCULAR | Status: AC
  Administered 2018-03-14: 5 mL
  Filled 2018-03-14: qty 30

## 2018-03-14 NOTE — ED Provider Notes (Signed)
[facility]EMERGENCY [facility]PT Provider Note   CSN: [id number] Arrival date & time: 03/14/18  0242     History   Chief Complaint Chief Complaint  Patient presents with  . Toe Injury    HPI Kathleen Guerra is a 27 y.o. female.  HPI 27 year old Caucasian female past medical history significant for anxiety and depression presents to the ED with laceration to the right pinky toe.  Patient states that she was helping a friend move a mirror when it fell and caught her toe.  Patient states her tetanus shot is up-to-date.  Denies paresthesias or weakness.  Has not taken anything for the pain prior to arrival.  Pain is worse with palpation and range of motion.  Denies any other associated symptoms. Past Medical History:  Diagnosis Date  . Anxiety   . Depression     Patient Active Problem List   Diagnosis Date Noted  . SVD (spontaneous vaginal delivery) 10/31/2017  . Gestational hypertension 10/29/2017    Past Surgical History:  Procedure Laterality Date  . EYE SURGERY    . TONSILLECTOMY       OB History    Gravida  1   Para      Term      Preterm      AB      Living        SAB      TAB      Ectopic      Multiple      Live Births               Home Medications    Prior to Admission medications   Medication Sig Start Date End Date Taking? Authorizing Provider  sertraline (ZOLOFT) 100 MG tablet Take 100 mg by mouth daily. 02/09/18  Yes [provider]  dicyclomine (BENTYL) 20 MG tablet Take 1 tablet (20 mg total) by mouth 2 (two) times daily. Patient not taking: Reported on 03/14/2018 03/13/18   Belinda FisherYu, Amy V, PA-C  ondansetron (ZOFRAN ODT) 4 MG disintegrating tablet Take 1 tablet (4 mg total) by mouth every 8 (eight) hours as needed for nausea or vomiting. Patient not taking: Reported on 03/14/2018 03/13/18   Lurline IdolYu, Amy V, PA-C    Family History Family History  Problem Relation Age of Onset  . Clotting disorder Father     Social  History Social History   Tobacco Use  . Smoking status: Former Smoker    Last attempt to quit: 10/29/2009    Years since quitting: 8.3  . Smokeless tobacco: Never Used  Substance Use Topics  . Alcohol use: Yes    Comment: socially  . Drug use: Not Currently     Allergies   Patient has no known allergies.   Review of Systems Review of Systems  Constitutional: Negative for fever.  Musculoskeletal: Positive for arthralgias and myalgias.  Skin: Positive for wound.  Neurological: Negative for weakness and numbness.     Physical Exam Updated Vital Signs BP (!) 111/55 (BP Location: Right Arm)   Pulse (!) 103   Temp 97.9 F (36.6 C) (Oral)   Resp 16   Ht 5\' 8"  (1.727 m)   Wt 72.6 kg (160 lb)   LMP 02/12/2018   SpO2 98%   BMI 24.33 kg/m   Physical Exam  Constitutional: She appears well-developed and well-nourished. No distress.  HENT:  Head: Normocephalic and atraumatic.  Eyes: Right eye exhibits no discharge. Left eye exhibits no discharge. No  scleral icterus.  Neck: Normal range of motion.  Pulmonary/Chest: No respiratory distress.  Musculoskeletal: Normal range of motion.  Full range of motion of the fifth digit on the right foot.  There is a 1 similar laceration to the dorsum of the fifth digit.  Bleeding is controlled.  Brisk cap refill.  Sensation intact.  Range of motion causes pain.  Good flexion extension of the digit.  DP pulses are 2+ bilaterally.  Brisk cap refill.  Mildly tender to palpation.  Neurological: She is alert.  Skin: Skin is dry. Capillary refill takes less than 2 seconds. No pallor.  Psychiatric: Her behavior is normal. Judgment and thought content normal.  Nursing note and vitals reviewed.    ED Treatments / Results  Labs (all labs ordered are listed, but only abnormal results are displayed) Labs Reviewed - No data to display  EKG None  Radiology Dg Toe 5th Right  Result Date: 03/14/2018 CLINICAL DATA:  27 year old female with injury  to the fifth toe. EXAM: RIGHT FIFTH TOE COMPARISON:  None. FINDINGS: There is no evidence of fracture or dislocation. There is no evidence of arthropathy or other focal bone abnormality. Soft tissues are unremarkable. IMPRESSION: Negative. Electronically Signed   By: Elgie Collard M.D.   On: 03/14/2018 03:36    Procedures .Marland KitchenLaceration Repair Date/Time: 03/14/2018 5:29 AM Performed by: Rise Mu, PA-C Authorized by: Rise Mu, PA-C   Consent:    Consent obtained:  Verbal   Consent given by:  Patient   Risks discussed:  Infection, need for additional repair, nerve damage, poor wound healing, poor cosmetic result, pain, retained foreign body, tendon damage and vascular damage   Alternatives discussed:  No treatment Anesthesia (see MAR for exact dosages):    Anesthesia method:  Local infiltration   Local anesthetic:  Lidocaine 1% w/o epi Laceration details:    Location:  Toe   Toe location:  R little toe   Length (cm):  1 Repair type:    Repair type:  Simple Pre-procedure details:    Preparation:  Patient was prepped and draped in usual sterile fashion and imaging obtained to evaluate for foreign bodies Exploration:    Hemostasis achieved with:  Direct pressure   Wound exploration: wound explored through full range of motion and entire depth of wound probed and visualized     Wound extent: no foreign bodies/material noted, no tendon damage noted and no underlying fracture noted     Contaminated: no   Treatment:    Area cleansed with:  Betadine and saline   Amount of cleaning:  Standard   Irrigation solution:  Sterile saline   Irrigation volume:  60   Irrigation method:  Pressure wash   Visualized foreign bodies/material removed: no   Skin repair:    Repair method:  Sutures   Suture size:  4-0   Suture material:  Prolene   Suture technique:  Simple interrupted   Number of sutures:  5 Approximation:    Approximation:  Close Post-procedure details:     Dressing:  Antibiotic ointment, non-adherent dressing, bulky dressing and splint for protection   Patient tolerance of procedure:  Tolerated well, no immediate complications   (including critical care time)  Medications Ordered in ED Medications  lidocaine (PF) (XYLOCAINE) 1 % injection 5 mL (has no administration in time range)  bacitracin ointment (has no administration in time range)  acetaminophen (TYLENOL) tablet 650 mg (650 mg Oral Given 03/14/18 0420)     Initial Impression /  Assessment and Plan / ED Course  I have reviewed the triage vital signs and the nursing notes.  Pertinent labs & imaging results that were available during my care of the patient were reviewed by me and considered in my medical decision making (see chart for details).     Tdap is utd.Pressure irrigation performed.  X-ray shows no bony involvement or retained foreign body.  Patient neurovascularly intact.  Full flexion-extension of the digit.  Laceration occurred < 8 hours prior to repair which was well tolerated. Pt has no co morbidities to effect normal wound healing. Discussed suture home care w pt and answered questions. Pt to f-u for wound check and suture removal in 7 days. Pt is hemodynamically stable w no complaints prior to dc.     Final Clinical Impressions(s) / ED Diagnoses   Final diagnoses:  Injury of toe on right foot, initial encounter    ED Discharge Orders    None       Wallace Keller 03/14/18 0530    Zadie Rhine, MD 03/14/18 [phone]

## 2018-03-14 NOTE — Discharge Instructions (Addendum)
WOUND CARE °Please have your stitches/staples removed in 7 or sooner if you have concerns. You may do this at any available urgent care or at your primary care doctor's office. ° Keep area clean and dry for 24 hours. Do not remove °bandage, if applied. ° After 24 hours, remove bandage and wash wound °gently with mild soap and warm water. Reapply °a new bandage after cleaning wound, if directed. ° Continue daily cleansing with soap and water until °stitches/staples are removed. ° Do not apply any ointments or creams to the wound °while stitches/staples are in place, as this may cause °delayed healing. ° Seek medical careif you experience any of the following °signs of infection: Swelling, redness, pus drainage, °streaking, fever >101.0 F ° Seek care if you experience excessive bleeding °that does not stop after 15-20 minutes of constant, firm °pressure. ° ° °

## 2018-03-14 NOTE — ED Triage Notes (Signed)
Pt cut her right foot fifth toe on the corner of a mirror tonight. Laceration to the toe.

## 2018-03-14 NOTE — ED Notes (Signed)
Patient transported to X-ray 

## 2018-06-17 ENCOUNTER — Encounter (HOSPITAL_COMMUNITY): Payer: Self-pay | Admitting: Emergency Medicine

## 2018-06-17 ENCOUNTER — Ambulatory Visit (HOSPITAL_COMMUNITY)
Admission: EM | Admit: 2018-06-17 | Discharge: 2018-06-17 | Disposition: A | Discharge status: Home / Self Care | Payer: 59 | Attending: Family Medicine | Admitting: Family Medicine

## 2018-06-17 DIAGNOSIS — J22 Unspecified acute lower respiratory infection: Secondary | ICD-10-CM | POA: Diagnosis not present

## 2018-06-17 MED ORDER — AZITHROMYCIN 250 MG PO TABS: ORAL_TABLET | ORAL | 0 refills | Status: AC

## 2018-06-17 MED ORDER — ALBUTEROL SULFATE HFA 108 (90 BASE) MCG/ACT IN AERS: 2.0000 | INHALATION_SPRAY | Freq: Four times a day (QID) | RESPIRATORY_TRACT | 2 refills | Status: AC | PRN

## 2018-06-17 NOTE — Discharge Instructions (Signed)
Push fluids to ensure adequate hydration and keep secretions thin.  Tylenol and/or ibuprofen as needed for pain or fevers.  Continue with tessalon perles and mucinex as previously recommended.  Inhaler as needed for wheezing, tightness, shortness of breath.  Complete course of antibiotics.  If symptoms worsen or do not improve in the next week to return to be seen or to follow up with your PCP.

## 2018-06-17 NOTE — ED Provider Notes (Signed)
[facility]    CSN: [id number] Arrival date & time: 06/17/18  0757     History   Chief Complaint Chief Complaint  Patient presents with  . Cough    HPI Kathleen Guerra is a 27 y.o. female.   Kathleen Guerra presents with complaints of worsening of cough. Started 2 weeks ago with cough and cold symptoms. [facility] and diagnosed with viral URI. Was using tessalon perles and mucinex for congestion and cough. Over the past three days cough has worsened. Has become more dry although still productive at times. Severe episodes of cough, caused 1 episode of post-tussive emesis. Poor sleep. Feels shortness of breath and chest pain related to cough. Cough also causes headache. Slight sore throat. No ear pain. Some nasal congestion but it has improved. No known fevers. No known ill contacts. No asthma history and doesn't smoke. Without contributing medical history.      ROS per HPI.      Past Medical History:  Diagnosis Date  . Anxiety   . Depression     Patient Active Problem List   Diagnosis Date Noted  . SVD (spontaneous vaginal delivery) 10/31/2017  . Gestational hypertension 10/29/2017    Past Surgical History:  Procedure Laterality Date  . EYE SURGERY    . TONSILLECTOMY      OB History    Gravida  1   Para      Term      Preterm      AB      Living        SAB      TAB      Ectopic      Multiple      Live Births               Home Medications    Prior to Admission medications   Medication Sig Start Date End Date Taking? Authorizing Provider  sertraline (ZOLOFT) 100 MG tablet Take 100 mg by mouth daily. 02/09/18  Yes [provider]  albuterol (PROVENTIL HFA;VENTOLIN HFA) 108 (90 Base) MCG/ACT inhaler Inhale 2 puffs into the lungs every 6 (six) hours as needed for wheezing or shortness of breath. 06/17/18   Georgetta Haber, NP  azithromycin (ZITHROMAX) 250 MG tablet Take 2 tablets (500 mg total) by mouth  daily for 1 day, THEN 1 tablet (250 mg total) daily for 4 days. 06/17/18 06/22/18  Georgetta Haber, NP    Family History Family History  Problem Relation Age of Onset  . Clotting disorder Father     Social History Social History   Tobacco Use  . Smoking status: Former Smoker    Last attempt to quit: 10/29/2009    Years since quitting: 8.6  . Smokeless tobacco: Never Used  Substance Use Topics  . Alcohol use: Yes    Comment: socially  . Drug use: Not Currently     Allergies   Patient has no known allergies.   Review of Systems Review of Systems   Physical Exam Triage Vital Signs ED Triage Vitals [06/17/18 0816]  Enc Vitals Group     BP 121/75     Pulse Rate 84     Resp 16     Temp 98.3 F (36.8 C)     Temp Source Oral     SpO2 99 %     Weight      Height      Head Circumference  Peak Flow      Pain Score 5     Pain Loc      Pain Edu?      Excl. in GC?    No data found.  Updated Vital Signs BP 121/75   Pulse 84   Temp 98.3 F (36.8 C) (Oral)   Resp 16   SpO2 99%    Physical Exam  Constitutional: She is oriented to person, place, and time. She appears well-developed and well-nourished. No distress.  HENT:  Head: Normocephalic and atraumatic.  Right Ear: Tympanic membrane, external ear and ear canal normal.  Left Ear: Tympanic membrane, external ear and ear canal normal.  Nose: Rhinorrhea present. Left sinus exhibits no maxillary sinus tenderness and no frontal sinus tenderness.  Mouth/Throat: Uvula is midline, oropharynx is clear and moist and mucous membranes are normal. No tonsillar exudate.  Eyes: Pupils are equal, round, and reactive to light. Conjunctivae and EOM are normal.  Cardiovascular: Normal rate, regular rhythm and normal heart sounds.  Pulmonary/Chest: Effort normal and breath sounds normal.  Occasional strong congested cough noted; lungs clear  Neurological: She is alert and oriented to person, place, and time.  Skin: Skin is  warm and dry.     UC Treatments / Results  Labs (all labs ordered are listed, but only abnormal results are displayed) Labs Reviewed - No data to display  EKG None  Radiology No results found.  Procedures Procedures (including critical care time)  Medications Ordered in UC Medications - No data to display  Initial Impression / Assessment and Plan / UC Course  I have reviewed the triage vital signs and the nursing notes.  Pertinent labs & imaging results that were available during my care of the patient were reviewed by me and considered in my medical decision making (see chart for details).     Two weeks of symptoms with worsening of cough. Will cover empirically with azithromycin at this time. Non toxic appearing today, afebrile, no increased work of breathing, tachypnea or hypoxia. Return precautions provided. If symptoms worsen or do not improve in the next week to return to be seen or to follow up with PCP.  Patient verbalized understanding and agreeable to plan.   Final Clinical Impressions(s) / UC Diagnoses   Final diagnoses:  Lower respiratory infection     Discharge Instructions     Push fluids to ensure adequate hydration and keep secretions thin.  Tylenol and/or ibuprofen as needed for pain or fevers.  Continue with tessalon perles and mucinex as previously recommended.  Inhaler as needed for wheezing, tightness, shortness of breath.  Complete course of antibiotics.  If symptoms worsen or do not improve in the next week to return to be seen or to follow up with your PCP.     ED Prescriptions    Medication Sig Dispense Auth. Provider   azithromycin (ZITHROMAX) 250 MG tablet Take 2 tablets (500 mg total) by mouth daily for 1 day, THEN 1 tablet (250 mg total) daily for 4 days. 6 tablet Linus Mako B, NP   albuterol (PROVENTIL HFA;VENTOLIN HFA) 108 (90 Base) MCG/ACT inhaler Inhale 2 puffs into the lungs every 6 (six) hours as needed for wheezing or shortness  of breath. 1 Inhaler Georgetta Haber, NP     Controlled Substance Prescriptions [place] Controlled Substance Registry consulted? Not Applicable   Georgetta Haber, NP 06/17/18 [phone]

## 2018-06-17 NOTE — ED Triage Notes (Signed)
PT reports cough for 2 weeks. It has worsened over last few days. Cough is dry and painful. Was seen 1.5 weeks ago and tried Psychiatrist and mucinex

## 2019-07-18 ENCOUNTER — Emergency Department (HOSPITAL_BASED_OUTPATIENT_CLINIC_OR_DEPARTMENT_OTHER)
Admission: EM | Admit: 2019-07-18 | Discharge: 2019-07-18 | Disposition: A | Discharge status: Home / Self Care | Payer: 59 | Attending: Emergency Medicine | Admitting: Emergency Medicine

## 2019-07-18 ENCOUNTER — Encounter (HOSPITAL_BASED_OUTPATIENT_CLINIC_OR_DEPARTMENT_OTHER): Payer: Self-pay

## 2019-07-18 ENCOUNTER — Other Ambulatory Visit: Payer: Self-pay

## 2019-07-18 DIAGNOSIS — Z87891 Personal history of nicotine dependence: Secondary | ICD-10-CM | POA: Insufficient documentation

## 2019-07-18 DIAGNOSIS — M549 Dorsalgia, unspecified: Secondary | ICD-10-CM | POA: Insufficient documentation

## 2019-07-18 DIAGNOSIS — R300 Dysuria: Secondary | ICD-10-CM | POA: Insufficient documentation

## 2019-07-18 LAB — URINALYSIS, ROUTINE W REFLEX MICROSCOPIC
Bilirubin Urine: NEGATIVE
Glucose, UA: NEGATIVE mg/dL
Ketones, ur: NEGATIVE mg/dL
Leukocytes,Ua: NEGATIVE
Nitrite: NEGATIVE
Protein, ur: NEGATIVE mg/dL
Specific Gravity, Urine: <1.005 — ABNORMAL LOW (ref 1.005–1.030)
pH: 6.5 (ref 5.0–8.0)

## 2019-07-18 LAB — URINALYSIS, MICROSCOPIC (REFLEX)

## 2019-07-18 LAB — URINE CULTURE

## 2019-07-18 LAB — PREGNANCY, URINE: Preg Test, Ur: NEGATIVE

## 2019-07-18 MED ORDER — CEPHALEXIN 500 MG PO CAPS: 500.0000 mg | ORAL_CAPSULE | Freq: Three times a day (TID) | ORAL | 0 refills | Status: DC

## 2019-07-18 MED ORDER — KETOROLAC TROMETHAMINE 30 MG/ML IJ SOLN
30.0000 mg | Freq: Once | INTRAMUSCULAR | Status: AC
  Administered 2019-07-18: 30 mg via INTRAMUSCULAR
  Filled 2019-07-18: qty 1

## 2019-07-18 MED ORDER — LIDOCAINE HCL (PF) 1 % IJ SOLN
2.0000 mL | Freq: Once | INTRAMUSCULAR | Status: AC
  Administered 2019-07-18: 2 mL
  Filled 2019-07-18: qty 5

## 2019-07-18 MED ORDER — CEFTRIAXONE SODIUM 1 G IJ SOLR
1.0000 g | Freq: Once | INTRAMUSCULAR | Status: AC
  Administered 2019-07-18: 01:00:00 1 g via INTRAMUSCULAR
  Filled 2019-07-18: qty 10

## 2019-07-18 NOTE — ED Notes (Signed)
Patient left without getting paperwork.

## 2019-07-18 NOTE — Discharge Instructions (Addendum)
You were seen today for urinary symptoms.  You will be treated for UTI.  Your urinalysis is without obvious infection however, you have been taking antibiotics and given your symptoms, feel it is reasonable to treat.  If you develop fevers, worsening pain, worsening symptoms you should be reevaluated.

## 2019-07-18 NOTE — ED Provider Notes (Signed)
[facility] Provider Note   CSN: [id number] Arrival date & time: 07/18/19  0010     History   Chief Complaint Chief Complaint  Patient presents with  . Dysuria    HPI KATASHA RIGA is a 28 y.o. female.     HPI  This is a 28 year old female who presents with dysuria and back pain.  Patient reports 2 to 3-week history of worsening dysuria and left-sided back pain.  She has a history of recurrent UTIs.  She states she has been unable to see a doctor because of insurance issues.  She is not noted any fevers but has noted chills.  She reports left-sided flank pain that is nonradiating.  She reports persistent dysuria.  She has taken Azo at home with minimal relief.  She does report over the last 4-5 days she has taken Macrobid that she got from her sister but she has had persistent symptoms.  Patient rates her pain at 10 out of 10.  She has not taken anything for her pain.  She reports nausea without vomiting or diarrhea.  Patient reports that her periods are irregular.  She does not know if she is pregnant.  Past Medical History:  Diagnosis Date  . Anxiety   . Depression     Patient Active Problem List   Diagnosis Date Noted  . SVD (spontaneous vaginal delivery) 10/31/2017  . Gestational hypertension 10/29/2017    Past Surgical History:  Procedure Laterality Date  . EYE SURGERY    . TONSILLECTOMY       OB History    Gravida  1   Para      Term      Preterm      AB      Living        SAB      TAB      Ectopic      Multiple      Live Births               Home Medications    Prior to Admission medications   Medication Sig Start Date End Date Taking? Authorizing Provider  albuterol (PROVENTIL HFA;VENTOLIN HFA) 108 (90 Base) MCG/ACT inhaler Inhale 2 puffs into the lungs every 6 (six) hours as needed for wheezing or shortness of breath. 06/17/18   Zigmund Gottron, NP  cephALEXin (KEFLEX) 500 MG capsule Take 1  capsule (500 mg total) by mouth 3 (three) times daily. 07/18/19   Melyssa Signor, Barbette Hair, MD  sertraline (ZOLOFT) 100 MG tablet Take 100 mg by mouth daily. 02/09/18   [provider]    Family History Family History  Problem Relation Age of Onset  . Clotting disorder Father     Social History Social History   Tobacco Use  . Smoking status: Former Smoker    Quit date: 10/29/2009    Years since quitting: 9.7  . Smokeless tobacco: Never Used  Substance Use Topics  . Alcohol use: Yes    Comment: socially  . Drug use: Not Currently     Allergies   Patient has no known allergies.   Review of Systems Review of Systems  Constitutional: Negative for fever.  Respiratory: Negative for shortness of breath.   Cardiovascular: Negative for chest pain.  Gastrointestinal: Negative for abdominal pain, nausea and vomiting.  Genitourinary: Positive for dysuria and flank pain. Negative for difficulty urinating and hematuria.  Psychiatric/Behavioral: The patient is not nervous/anxious.   All other  systems reviewed and are negative.    Physical Exam Updated Vital Signs BP 136/84   Pulse 98   Temp 98 F (36.7 C) (Oral)   Resp 17   Ht 1.727 m (5\' 8" )   Wt 77.1 kg   SpO2 99%   BMI 25.85 kg/m   Physical Exam Vitals signs and nursing note reviewed.  Constitutional:      Appearance: She is well-developed. She is not ill-appearing.  HENT:     Head: Normocephalic and atraumatic.  Eyes:     Pupils: Pupils are equal, round, and reactive to light.  Neck:     Musculoskeletal: Neck supple.  Cardiovascular:     Rate and Rhythm: Normal rate and regular rhythm.  Pulmonary:     Effort: Pulmonary effort is normal. No respiratory distress.     Breath sounds: No wheezing.  Abdominal:     General: Bowel sounds are normal.     Palpations: Abdomen is soft.     Tenderness: There is no right CVA tenderness or left CVA tenderness.  Skin:    General: Skin is warm and dry.  Neurological:      Mental Status: She is alert and oriented to person, place, and time.  Psychiatric:        Mood and Affect: Mood normal.      ED Treatments / Results  Labs (all labs ordered are listed, but only abnormal results are displayed) Labs Reviewed  URINALYSIS, ROUTINE W REFLEX MICROSCOPIC - Abnormal; Notable for the following components:      Result Value   Color, Urine STRAW (*)    Specific Gravity, Urine <1.005 (*)    Hgb urine dipstick TRACE (*)    All other components within normal limits  URINALYSIS, MICROSCOPIC (REFLEX) - Abnormal; Notable for the following components:   Bacteria, UA FEW (*)    All other components within normal limits  URINE CULTURE  PREGNANCY, URINE    EKG None  Radiology No results found.  Procedures Procedures (including critical care time)  Medications Ordered in ED Medications  cefTRIAXone (ROCEPHIN) injection 1 g (1 g Intramuscular Given 07/18/19 0124)  ketorolac (TORADOL) 30 MG/ML injection 30 mg (30 mg Intramuscular Given 07/18/19 0125)  lidocaine (PF) (XYLOCAINE) 1 % injection 2 mL (2 mLs Other Given 07/18/19 0124)     Initial Impression / Assessment and Plan / ED Course  I have reviewed the triage vital signs and the nursing notes.  Pertinent labs & imaging results that were available during my care of the patient were reviewed by me and considered in my medical decision making (see chart for details).        Patient presents with dysuria.  Also reports left flank pain.  She is overall nontoxic-appearing vital signs are reassuring.  Considerations include lower urinary tract infection, pyelonephritis, kidney stone.  Patient has already self treated with this with Azo and Macrobid.  Urine pregnancy is negative.  Urinalysis does not show any obvious infection; however, this is in the setting of partial treatment with Macrobid.  We will send culture.  Given persistent symptoms, plan to treat with Keflex.  Unfortunately, patient left without  receiving her antibiotics at discharge.  She did receive 1 dose of Rocephin while in the ED.  After history, exam, and medical workup I feel the patient has been appropriately medically screened and is safe for discharge home. Pertinent diagnoses were discussed with the patient. Patient was given return precautions.   Final Clinical Impressions(s) /  ED Diagnoses   Final diagnoses:  Dysuria    ED Discharge Orders         Ordered    cephALEXin (KEFLEX) 500 MG capsule  3 times daily     07/18/19 0139           Shon BatonHorton, Addilynn Mowrer F, MD 07/18/19 [phone]

## 2019-07-18 NOTE — ED Triage Notes (Signed)
Pt presents with dysuria x 2 weeks- pt also c/o L flank pain. Hx of UTIs. Denies fevers

## 2020-01-08 ENCOUNTER — Encounter (HOSPITAL_BASED_OUTPATIENT_CLINIC_OR_DEPARTMENT_OTHER): Payer: Self-pay | Admitting: *Deleted

## 2020-01-08 ENCOUNTER — Other Ambulatory Visit: Payer: Self-pay

## 2020-01-08 ENCOUNTER — Emergency Department (HOSPITAL_BASED_OUTPATIENT_CLINIC_OR_DEPARTMENT_OTHER)
Admission: EM | Admit: 2020-01-08 | Discharge: 2020-01-08 | Disposition: A | Discharge status: Home / Self Care | Attending: Emergency Medicine | Admitting: Emergency Medicine

## 2020-01-08 ENCOUNTER — Emergency Department (HOSPITAL_BASED_OUTPATIENT_CLINIC_OR_DEPARTMENT_OTHER)

## 2020-01-08 DIAGNOSIS — Z87891 Personal history of nicotine dependence: Secondary | ICD-10-CM | POA: Diagnosis not present

## 2020-01-08 DIAGNOSIS — Y939 Activity, unspecified: Secondary | ICD-10-CM | POA: Insufficient documentation

## 2020-01-08 DIAGNOSIS — Z23 Encounter for immunization: Secondary | ICD-10-CM | POA: Diagnosis not present

## 2020-01-08 DIAGNOSIS — W25XXXA Contact with sharp glass, initial encounter: Secondary | ICD-10-CM | POA: Diagnosis not present

## 2020-01-08 DIAGNOSIS — Z79899 Other long term (current) drug therapy: Secondary | ICD-10-CM | POA: Diagnosis not present

## 2020-01-08 DIAGNOSIS — Y990 Civilian activity done for income or pay: Secondary | ICD-10-CM | POA: Insufficient documentation

## 2020-01-08 DIAGNOSIS — S61215A Laceration without foreign body of left ring finger without damage to nail, initial encounter: Secondary | ICD-10-CM

## 2020-01-08 DIAGNOSIS — Y9259 Other trade areas as the place of occurrence of the external cause: Secondary | ICD-10-CM | POA: Insufficient documentation

## 2020-01-08 MED ORDER — LIDOCAINE-EPINEPHRINE 1 %-1:100000 IJ SOLN
INTRAMUSCULAR | Status: AC
  Filled 2020-01-08: qty 1

## 2020-01-08 MED ORDER — LIDOCAINE-EPINEPHRINE 2 %-1:100000 IJ SOLN
20.0000 mL | Freq: Once | INTRAMUSCULAR | Status: AC
  Administered 2020-01-08: 20 mL

## 2020-01-08 MED ORDER — TETANUS-DIPHTH-ACELL PERTUSSIS 5-2.5-18.5 LF-MCG/0.5 IM SUSP
0.5000 mL | Freq: Once | INTRAMUSCULAR | Status: AC
  Administered 2020-01-08: 0.5 mL via INTRAMUSCULAR
  Filled 2020-01-08: qty 0.5

## 2020-01-08 NOTE — ED Provider Notes (Addendum)
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  A glass broke just prior to arrival causing a laceration to her right ring finger.  She is having difficulty bending her finger due to the laceration.  She rates associated pain is a 5 out of 10, worse with palpation or movement.  Bleeding is currently controlled.  She is unsure if she has glass in the wound.  Tetanus was updated prior to my evaluation.    Past Medical History:  Diagnosis Date  . Anxiety   . Depression     Past Surgical History:  Procedure Laterality Date  . EYE SURGERY    . TONSILLECTOMY      Family History  Problem Relation Age of Onset  . Clotting disorder Father     Social History   Tobacco Use  . Smoking status: Former Smoker    Quit date: 10/29/2009    Years since quitting: 10.2  . Smokeless tobacco: Never Used  Substance Use Topics  . Alcohol use: Yes    Comment: socially  . Drug use: Not Currently    Prior to Admission medications   Medication Sig Start Date End Date Taking? Authorizing Provider  albuterol (PROVENTIL HFA;VENTOLIN HFA) 108 (90 Base) MCG/ACT inhaler Inhale 2 puffs into the lungs every 6 (six) hours as needed for wheezing or shortness of breath. 06/17/18   Zigmund Gottron, NP  cephALEXin (KEFLEX) 500 MG capsule Take 1 capsule (500 mg total) by mouth 3 (three) times daily. 07/18/19   Horton, Barbette Hair, MD  sertraline (ZOLOFT) 100 MG tablet Take 150 mg by mouth daily.  02/09/18   [provider]    Allergies Patient has no known allergies.   REVIEW OF SYSTEMS  Negative except as noted here or in the History of Present Illness.   PHYSICAL EXAMINATION  Initial Vital Signs Blood pressure 134/81, pulse 98, temperature 98.5 F (36.9 C),  temperature source Oral, resp. rate 20, height 5\' 8"  (1.727 m), weight 80.3 kg, SpO2 100 %.  Examination General: Well-developed, well-nourished female in no acute distress; appearance consistent with age of record HENT: normocephalic; atraumatic Eyes: Normal appearance Neck: supple Heart: regular rate and rhythm Lungs: clear to auscultation bilaterally Abdomen: soft; nondistended; nontender; bowel sounds present Extremities: No deformity; full range of motion; pulses normal; laceration over the volar aspect of the left ring finger PIP joint, tendon function intact but range of motion limited by pain, finger distally neurovascularly intact:    Neurologic: Awake, alert and oriented; motor function intact in all extremities and symmetric; no facial droop Skin: Warm and dry Psychiatric: Normal mood and affect   RESULTS  Summary of this visit's results, reviewed and interpreted by myself:   EKG Interpretation  Date/Time:    Ventricular Rate:    PR Interval:    QRS Duration:   QT Interval:    QTC Calculation:   R Axis:     Text Interpretation:        Laboratory Studies: No results found for this or any previous visit (from the past 24 hour(s)). Imaging Studies: DG Hand Complete Right  Result Date: 01/08/2020 CLINICAL DATA:  Laceration EXAM: RIGHT HAND - COMPLETE 3+ VIEW COMPARISON:  None. FINDINGS: There is no evidence of fracture  or dislocation. There is no evidence of arthropathy or other focal bone abnormality. No radiopaque foreign body IMPRESSION: Negative. Electronically Signed   By: Jasmine Pang M.D.   On: 01/08/2020 03:12    ED COURSE and MDM  Nursing notes, initial and subsequent vitals signs, including pulse oximetry, reviewed and interpreted by myself.  Vitals:   01/08/20 0217 01/08/20 0220  BP:  134/81  Pulse:  98  Resp:  20  Temp:  98.5 F (36.9 C)  TempSrc:  Oral  SpO2:  100%  Weight: 80.3 kg   Height: 5\' 8"  (1.727 m)    Medications    lidocaine-EPINEPHrine (XYLOCAINE W/EPI) 2 %-1:100000 (with pres) injection 20 mL (has no administration in time range)  lidocaine-EPINEPHrine (XYLOCAINE W/EPI) 1 %-1:100000 (with pres) injection (has no administration in time range)  Tdap (BOOSTRIX) injection 0.5 mL (0.5 mLs Intramuscular Given 01/08/20 0431)   Wound closed primarily.  I do not believe antibiotics are indicated at this time.  The wound is clean and not through the full-thickness of the skin.  There is no involvement of underlying structures such as tendon.   PROCEDURES  Procedures  LACERATION REPAIR Performed by: 01/10/20 Hazim Treadway Authorized by: Carlisle Beers Janalee Grobe Consent: Verbal consent obtained. Risks and benefits: risks, benefits and alternatives were discussed Consent given by: patient Patient identity confirmed: provided demographic data Prepped and Draped in normal sterile fashion Wound explored  Laceration Location: Left ring finger  Laceration Length: 1 cm  No Foreign Bodies seen or palpated  Anesthesia: local infiltration  Local anesthetic: lidocaine 2% with epinephrine  Anesthetic total: 1 ml  Irrigation method: syringe Amount of cleaning: standard  Skin closure: 4-0 Prolene  Number of sutures: 2  Technique: Simple interrupted  Patient tolerance: Patient tolerated the procedure well with no immediate complications.   ED DIAGNOSES     ICD-10-CM   1. Laceration of left ring finger without foreign body without damage to nail, initial encounter  S61.215A   2. Injury from broken glass, initial encounter  W25.Carlisle Beers, MD 01/08/20 01/10/20    [id number], MD 01/08/20 01/10/20

## 2020-01-08 NOTE — ED Triage Notes (Addendum)
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 States difficult to bend finger due to laceration.  Bleeding controlled at present. Unsure if she has glass in the wound. Td has been at least 10 years

## 2020-04-25 ENCOUNTER — Emergency Department (HOSPITAL_BASED_OUTPATIENT_CLINIC_OR_DEPARTMENT_OTHER)
Admission: EM | Admit: 2020-04-25 | Discharge: 2020-04-25 | Disposition: A | Discharge status: Home / Self Care | Payer: Self-pay | Attending: Emergency Medicine | Admitting: Emergency Medicine

## 2020-04-25 ENCOUNTER — Other Ambulatory Visit: Payer: Self-pay

## 2020-04-25 ENCOUNTER — Emergency Department (HOSPITAL_BASED_OUTPATIENT_CLINIC_OR_DEPARTMENT_OTHER): Payer: Self-pay

## 2020-04-25 DIAGNOSIS — R109 Unspecified abdominal pain: Secondary | ICD-10-CM | POA: Insufficient documentation

## 2020-04-25 DIAGNOSIS — N10 Acute tubulo-interstitial nephritis: Secondary | ICD-10-CM | POA: Insufficient documentation

## 2020-04-25 DIAGNOSIS — N12 Tubulo-interstitial nephritis, not specified as acute or chronic: Secondary | ICD-10-CM

## 2020-04-25 DIAGNOSIS — Z79899 Other long term (current) drug therapy: Secondary | ICD-10-CM | POA: Insufficient documentation

## 2020-04-25 DIAGNOSIS — Z87891 Personal history of nicotine dependence: Secondary | ICD-10-CM | POA: Insufficient documentation

## 2020-04-25 LAB — CBC WITH DIFFERENTIAL/PLATELET
Abs Immature Granulocytes: 0.02 10*3/uL (ref 0.00–0.07)
Basophils Absolute: 0 10*3/uL (ref 0.0–0.1)
Basophils Relative: 0 %
Eosinophils Absolute: 0 10*3/uL (ref 0.0–0.5)
Eosinophils Relative: 0 %
HCT: 42 % (ref 36.0–46.0)
Hemoglobin: 14.3 g/dL (ref 12.0–15.0)
Immature Granulocytes: 0 %
Lymphocytes Relative: 13 %
Lymphs Abs: 0.9 10*3/uL (ref 0.7–4.0)
MCH: 31.6 pg (ref 26.0–34.0)
MCHC: 34 g/dL (ref 30.0–36.0)
MCV: 92.9 fL (ref 80.0–100.0)
Monocytes Absolute: 0.6 10*3/uL (ref 0.1–1.0)
Monocytes Relative: 9 %
Neutro Abs: 5.5 10*3/uL (ref 1.7–7.7)
Neutrophils Relative %: 78 %
Platelets: 223 10*3/uL (ref 150–400)
RBC: 4.52 MIL/uL (ref 3.87–5.11)
RDW: 12.9 % (ref 11.5–15.5)
WBC: 7 10*3/uL (ref 4.0–10.5)
nRBC: 0 % (ref 0.0–0.2)

## 2020-04-25 LAB — URINALYSIS, ROUTINE W REFLEX MICROSCOPIC
Bilirubin Urine: NEGATIVE
Glucose, UA: NEGATIVE mg/dL
Ketones, ur: 15 mg/dL — AB
Nitrite: NEGATIVE
Protein, ur: NEGATIVE mg/dL
Specific Gravity, Urine: 1.025 (ref 1.005–1.030)
pH: 6 (ref 5.0–8.0)

## 2020-04-25 LAB — BASIC METABOLIC PANEL
Anion gap: 14 (ref 5–15)
BUN: 8 mg/dL (ref 6–20)
CO2: 23 mmol/L (ref 22–32)
Calcium: 9.6 mg/dL (ref 8.9–10.3)
Chloride: 100 mmol/L (ref 98–111)
Creatinine, Ser: 0.76 mg/dL (ref 0.44–1.00)
GFR calc Af Amer: >60 mL/min (ref >60)
GFR calc non Af Amer: >60 mL/min (ref >60)
Glucose, Bld: 92 mg/dL (ref 70–99)
Potassium: 4 mmol/L (ref 3.5–5.1)
Sodium: 137 mmol/L (ref 135–145)

## 2020-04-25 LAB — URINALYSIS, MICROSCOPIC (REFLEX)

## 2020-04-25 LAB — PREGNANCY, URINE: Preg Test, Ur: NEGATIVE

## 2020-04-25 MED ORDER — KETOROLAC TROMETHAMINE 30 MG/ML IJ SOLN
30.0000 mg | Freq: Once | INTRAMUSCULAR | Status: AC
  Administered 2020-04-25: 30 mg via INTRAVENOUS
  Filled 2020-04-25: qty 1

## 2020-04-25 MED ORDER — SODIUM CHLORIDE 0.9 % IV BOLUS
1000.0000 mL | Freq: Once | INTRAVENOUS | Status: AC
  Administered 2020-04-25: 1000 mL via INTRAVENOUS

## 2020-04-25 MED ORDER — SODIUM CHLORIDE 0.9 % IV SOLN
1.0000 g | Freq: Once | INTRAVENOUS | Status: AC
  Administered 2020-04-25: 1 g via INTRAVENOUS
  Filled 2020-04-25: qty 10

## 2020-04-25 MED ORDER — CEPHALEXIN 500 MG PO CAPS: 500.0000 mg | ORAL_CAPSULE | Freq: Three times a day (TID) | ORAL | 0 refills | Status: AC

## 2020-04-25 NOTE — ED Triage Notes (Signed)
Pt c/o dysuria and hematuria x 3 days

## 2020-04-25 NOTE — ED Provider Notes (Signed)
[facility] EMERGENCY DEPARTMENT Provider Note   CSN: [id number] Arrival date & time: 04/25/20  1802    History Chief Complaint  Patient presents with  . Dysuria    Kathleen Guerra is a 29 y.o. female with no past medical history who presents for evaluation of right flank pain and dysuria.  Has noticed blood in her urine over the last 3 days.  Has has a nagging, aching sensation to her right flank and suprapubic region.  No pelvic pain, vaginal discharge.  Has had decreased p.o. intake at home however has no vomiting or nausea.  Denies headache, weakness, dizziness, chest pain, shortness of breath, no vaginal discharge, concerns for STDs.  Denies aggravating or relieving factors.  Has had multiple infections in her kidneys previously.  History of obtained from patient and past medical records. No interpretor was used.  HPI     Past Medical History:  Diagnosis Date  . Anxiety   . Depression     Patient Active Problem List   Diagnosis Date Noted  . SVD (spontaneous vaginal delivery) 10/31/2017  . Gestational hypertension 10/29/2017    Past Surgical History:  Procedure Laterality Date  . EYE SURGERY    . TONSILLECTOMY       OB History    Gravida  1   Para      Term      Preterm      AB      Living        SAB      TAB      Ectopic      Multiple      Live Births              Family History  Problem Relation Age of Onset  . Clotting disorder Father     Social History   Tobacco Use  . Smoking status: Former Smoker    Quit date: 10/29/2009    Years since quitting: 10.4  . Smokeless tobacco: Never Used  Vaping Use  . Vaping Use: Never used  Substance Use Topics  . Alcohol use: Yes    Comment: socially  . Drug use: Not Currently    Home Medications Prior to Admission medications   Medication Sig Start Date End Date Taking? Authorizing Provider  albuterol (PROVENTIL HFA;VENTOLIN HFA) 108 (90 Base) MCG/ACT inhaler Inhale 2 puffs  into the lungs every 6 (six) hours as needed for wheezing or shortness of breath. 06/17/18   Georgetta Haber, NP  cephALEXin (KEFLEX) 500 MG capsule Take 1 capsule (500 mg total) by mouth 3 (three) times daily for 7 days. 04/25/20 05/02/20  Kyleen Villatoro A, PA-C  sertraline (ZOLOFT) 100 MG tablet Take 150 mg by mouth daily.  02/09/18   [provider]    Allergies    Patient has no known allergies.  Review of Systems   Review of Systems  Constitutional: Negative.   HENT: Negative.   Respiratory: Negative.   Cardiovascular: Negative.   Gastrointestinal: Positive for abdominal pain. Negative for abdominal distention, anal bleeding, blood in stool, constipation, diarrhea, nausea, rectal pain and vomiting.  Genitourinary: Positive for dysuria, flank pain, frequency and hematuria.  Skin: Negative.   Neurological: Negative.   All other systems reviewed and are negative.   Physical Exam Updated Vital Signs BP (!) 139/100   Pulse 95   Temp 98.4 F (36.9 C) (Oral)   Resp 16   Ht 5\' 8"  (1.727 m)   Wt 80.3  kg   SpO2 100%   BMI 26.91 kg/m   Physical Exam Vitals and nursing note reviewed.  Constitutional:      General: She is not in acute distress.    Appearance: She is well-developed. She is not ill-appearing, toxic-appearing or diaphoretic.  HENT:     Head: Normocephalic and atraumatic.     Nose: Nose normal.     Mouth/Throat:     Mouth: Mucous membranes are moist.  Eyes:     Pupils: Pupils are equal, round, and reactive to light.  Cardiovascular:     Rate and Rhythm: Normal rate.     Pulses: Normal pulses.     Heart sounds: Normal heart sounds.  Pulmonary:     Effort: No respiratory distress.     Breath sounds: Normal breath sounds.  Abdominal:     General: Bowel sounds are normal. There is no distension.     Palpations: Abdomen is soft.     Tenderness: There is abdominal tenderness in the suprapubic area. There is right CVA tenderness. There is no left CVA  tenderness, guarding or rebound. Negative signs include Murphy's sign and McBurney's sign.     Hernia: No hernia is present.       Comments: Tenderness to suprapubic region without rebound or guarding.  No overlying skin changes  Musculoskeletal:        General: Normal range of motion.     Cervical back: Normal range of motion.     Comments: Moves all 4 extremities without difficulty  Skin:    General: Skin is warm and dry.     Capillary Refill: Capillary refill takes less than 2 seconds.     Comments: No edema, erythema or warmth  Neurological:     General: No focal deficit present.     Mental Status: She is alert.     Comments: Ambulatory without difficulty     ED Results / Procedures / Treatments   Labs (all labs ordered are listed, but only abnormal results are displayed) Labs Reviewed  URINALYSIS, ROUTINE W REFLEX MICROSCOPIC - Abnormal; Notable for the following components:      Result Value   APPearance HAZY (*)    Hgb urine dipstick MODERATE (*)    Ketones, ur 15 (*)    Leukocytes,Ua MODERATE (*)    All other components within normal limits  URINALYSIS, MICROSCOPIC (REFLEX) - Abnormal; Notable for the following components:   Bacteria, UA MANY (*)    All other components within normal limits  URINE CULTURE  PREGNANCY, URINE  CBC WITH DIFFERENTIAL/PLATELET  BASIC METABOLIC PANEL    EKG None  Radiology CT Renal Stone Study  Result Date: 04/25/2020 CLINICAL DATA:  Hematuria EXAM: CT ABDOMEN AND PELVIS WITHOUT CONTRAST TECHNIQUE: Multidetector CT imaging of the abdomen and pelvis was performed following the standard protocol without IV contrast. COMPARISON:  None. FINDINGS: Lower chest: No acute abnormality. Hepatobiliary: No focal liver abnormality is seen. No gallstones, gallbladder wall thickening, or biliary dilatation. Pancreas: Unremarkable. Spleen: Unremarkable. Adrenals/Urinary Tract: Adrenals are unremarkable. No renal calculi or hydronephrosis. Ureters are  unremarkable. Bladder is unremarkable. Stomach/Bowel: Stomach is within normal limits. Bowel is normal in caliber. Normal appendix. Vascular/Lymphatic: No significant vascular abnormality on this noncontrast study. No enlarged lymph nodes. Reproductive: Intrauterine device is present at the fundus. Bilateral adnexa are unremarkable. Other: No ascites.  Abdominal wall is unremarkable. Musculoskeletal: No acute or significant osseous findings. IMPRESSION: No acute abnormality or findings to account for reported symptoms. Electronically Signed  By: Guadlupe Spanish M.D.   On: 04/25/2020 21:40    Procedures Procedures (including critical care time)  Medications Ordered in ED Medications  sodium chloride 0.9 % bolus 1,000 mL (1,000 mLs Intravenous New Bag/Given 04/25/20 2124)  cefTRIAXone (ROCEPHIN) 1 g in sodium chloride 0.9 % 100 mL IVPB (0 g Intravenous Stopped 04/25/20 2203)  ketorolac (TORADOL) 30 MG/ML injection 30 mg (30 mg Intravenous Given 04/25/20 2130)   ED Course  I have reviewed the triage vital signs and the nursing notes.  Pertinent labs & imaging results that were available during my care of the patient were reviewed by me and considered in my medical decision making (see chart for details).  29 year old presents for evaluation dysuria, hematuria and right flank pain.  She is afebrile, nonseptic, non-ill-appearing.  Appears otherwise well.  Has positive CVA tap on right.  Tenderness palpation of suprapubic region.  She has no pelvic pain, vaginal discharge or concerns for STDs.  Has had hematuria worse yesterday however has gradually improved today.  Has not take anything for pain.  Described as nagging.  Heart and lungs clear.  Plan on labs, imaging and reassess  Labs and imaging personally reviewed and interpreted: CBC without leukocytosis Metabolic panel without electrolyte, renal or liver abnormality Urinalysis positive for infection, culture sent Pregnancy negative CT stone without  evidence of renal stone, infectious process.  Patient tolerating p.o. intake without difficulty.  Patient exam and labs consistent with pyelonephritis.  She was given antibiotics and fluids here in the emergency department as well as Toradol.  Her pain is controlled.  Will DC home with Keflex.  Discussed return precautions with patient.  Patient voiced understanding agreeable for follow-up.  Patient does not meet the SIRS or Sepsis criteria.  On repeat exam patient does not have a surgical abdomin and there are no peritoneal signs.  No indication of appendicitis, bowel obstruction, bowel perforation, cholecystitis, diverticulitis, ovarian torsion, PID, TOA, infected renal stone.  The patient has been appropriately medically screened and/or stabilized in the ED. I have low suspicion for any other emergent medical condition which would require further screening, evaluation or treatment in the ED or require inpatient management.  Patient is hemodynamically stable and in no acute distress.  Patient able to ambulate in department prior to ED.  Evaluation does not show acute pathology that would require ongoing or additional emergent interventions while in the emergency department or further inpatient treatment.  I have discussed the diagnosis with the patient and answered all questions.  Pain is been managed while in the emergency department and patient has no further complaints prior to discharge.  Patient is comfortable with plan discussed in room and is stable for discharge at this time.  I have discussed strict return precautions for returning to the emergency department.  Patient was encouraged to follow-up with PCP/specialist refer to at discharge.    MDM Rules/Calculators/A&P                           Final Clinical Impression(s) / ED Diagnoses Final diagnoses:  Pyelonephritis    Rx / DC Orders ED Discharge Orders         Ordered    cephALEXin (KEFLEX) 500 MG capsule  3 times daily         04/25/20 2210           Rachelann Enloe A, PA-C 04/25/20 2222    Terrilee Files, MD 04/26/20 1058

## 2020-04-25 NOTE — Discharge Instructions (Signed)
Take the antibiotics as prescribed  Return for new worsening symptoms

## 2020-04-25 NOTE — ED Notes (Signed)
ED Provider at bedside. 

## 2020-04-26 LAB — URINE CULTURE: Culture: 10000 — AB

## 2020-06-04 ENCOUNTER — Encounter (HOSPITAL_COMMUNITY): Payer: Self-pay | Admitting: Emergency Medicine

## 2020-06-04 ENCOUNTER — Emergency Department (HOSPITAL_COMMUNITY): Payer: Self-pay

## 2020-06-04 ENCOUNTER — Emergency Department (HOSPITAL_COMMUNITY)
Admission: EM | Admit: 2020-06-04 | Discharge: 2020-06-04 | Disposition: A | Discharge status: Home / Self Care | Payer: Self-pay | Attending: Emergency Medicine | Admitting: Emergency Medicine

## 2020-06-04 DIAGNOSIS — S6391XA Sprain of unspecified part of right wrist and hand, initial encounter: Secondary | ICD-10-CM | POA: Insufficient documentation

## 2020-06-04 DIAGNOSIS — S63501A Unspecified sprain of right wrist, initial encounter: Secondary | ICD-10-CM

## 2020-06-04 DIAGNOSIS — Z87891 Personal history of nicotine dependence: Secondary | ICD-10-CM | POA: Insufficient documentation

## 2020-06-04 DIAGNOSIS — W01198A Fall on same level from slipping, tripping and stumbling with subsequent striking against other object, initial encounter: Secondary | ICD-10-CM | POA: Insufficient documentation

## 2020-06-04 NOTE — ED Provider Notes (Signed)
[facility]-EMERGENCY DEPT Provider Note   CSN: [id number] Arrival date & time: 06/04/20  0414     History Chief Complaint  Patient presents with  . Wrist Pain    R    Kathleen Guerra is a 29 y.o. female.  The history is provided by the patient.  Wrist Pain This is a new problem. The current episode started 6 to 12 hours ago. The problem occurs constantly. The problem has not changed since onset.Exacerbated by: Movement and palpation. The symptoms are relieved by rest.  Patient reports she tripped and fell into a door frame.  She hit the side of her right wrist.  She also hit her head, but no LOC.  She reports pain and numbness in her right wrist and fingers. No other acute traumatic injury reported     Past Medical History:  Diagnosis Date  . Anxiety   . Depression     Patient Active Problem List   Diagnosis Date Noted  . SVD (spontaneous vaginal delivery) 10/31/2017  . Gestational hypertension 10/29/2017    Past Surgical History:  Procedure Laterality Date  . EYE SURGERY    . TONSILLECTOMY       OB History    Gravida  1   Para      Term      Preterm      AB      Living        SAB      TAB      Ectopic      Multiple      Live Births              Family History  Problem Relation Age of Onset  . Clotting disorder Father     Social History   Tobacco Use  . Smoking status: Former Smoker    Quit date: 10/29/2009    Years since quitting: 10.6  . Smokeless tobacco: Never Used  Vaping Use  . Vaping Use: Never used  Substance Use Topics  . Alcohol use: Yes    Comment: socially  . Drug use: Not Currently    Home Medications Prior to Admission medications   Medication Sig Start Date End Date Taking? Authorizing Provider  albuterol (PROVENTIL HFA;VENTOLIN HFA) 108 (90 Base) MCG/ACT inhaler Inhale 2 puffs into the lungs every 6 (six) hours as needed for wheezing or shortness of breath. 06/17/18   Georgetta Haber,  NP  sertraline (ZOLOFT) 100 MG tablet Take 150 mg by mouth daily.  02/09/18   [provider]    Allergies    Patient has no known allergies.  Review of Systems   Review of Systems  Musculoskeletal: Positive for arthralgias and joint swelling.  Neurological: Positive for numbness.    Physical Exam Updated Vital Signs BP 127/74 (BP Location: Left Arm)   Pulse 97   Temp 97.7 F (36.5 C) (Oral)   Resp 18   Ht 1.727 m (5\' 8" )   Wt 72.6 kg   SpO2 94%   BMI 24.33 kg/m   Physical Exam CONSTITUTIONAL: Well developed/well nourished HEAD: Mild tenderness to scalp, no signs of trauma. EYES: EOMI/PERRL ENMT: Mask in place NECK: supple no meningeal signs LUNGS:  no apparent distress NEURO: Pt is awake/alert/appropriate, moves all extremitiesx4.  No facial droop.  Subjective numbness in her fingers of right hand. EXTREMITIES: pulses normal/equal, full ROM, tenderness noted to lateral aspect of right wrist.  No deformities or lacerations.  There is  no joint swelling.  No snuffbox tenderness.  No tenderness to right hand.  Full range of motion of right elbow.  Limited range of motion of right wrist due to pain. SKIN: warm, color normal PSYCH: no abnormalities of mood noted, alert and oriented to situation  ED Results / Procedures / Treatments   Labs (all labs ordered are listed, but only abnormal results are displayed) Labs Reviewed - No data to display  EKG None  Radiology DG Wrist Complete Right  Result Date: 06/04/2020 CLINICAL DATA:  Fall.  Right wrist injury. EXAM: RIGHT WRIST - COMPLETE 3+ VIEW COMPARISON:  None. FINDINGS: There is no evidence of fracture or dislocation. There is no evidence of arthropathy or other focal bone abnormality. Soft tissues are unremarkable. IMPRESSION: Negative. Electronically Signed   By: Kennith Center M.D.   On: 06/04/2020 05:10    Procedures Procedures    Medications Ordered in ED Medications - No data to display  ED Course  I  have reviewed the triage vital signs and the nursing notes.  Pertinent  imaging results that were available during my care of the patient were reviewed by me and considered in my medical decision making (see chart for details).    MDM Rules/Calculators/A&P                          No acute fracture on x-ray.  No evidence of occult scaphoid injury.  Offered ibuprofen and wrist splint, patient declines.  Patient will be discharged home  She reports she did hit her head, but no LOC, no vomiting and this was over 6 hours ago.  No indication for CT head at this time Final Clinical Impression(s) / ED Diagnoses Final diagnoses:  Sprain of right wrist, initial encounter    Rx / DC Orders ED Discharge Orders    None       Zadie Rhine, MD 06/04/20 [phone]

## 2020-06-04 NOTE — ED Triage Notes (Signed)
Pt reports that she fell and caught herself by running in to a wall. Felt her R wrist pop. States that her fingers are numb.

## 2020-09-20 ENCOUNTER — Ambulatory Visit: Payer: Self-pay | Admitting: Nurse Practitioner

## 2020-09-25 ENCOUNTER — Encounter: Payer: Self-pay | Admitting: Nurse Practitioner

## 2020-09-25 ENCOUNTER — Telehealth: Payer: Self-pay | Admitting: Nurse Practitioner

## 2020-09-25 NOTE — Telephone Encounter (Signed)
Pt was no show for appt 09/20/2020 for new pt visit. 1st occurrence. Fee waived. Letter mailed.

## 2021-07-29 ENCOUNTER — Ambulatory Visit (INDEPENDENT_AMBULATORY_CARE_PROVIDER_SITE_OTHER): Payer: Medicaid Other | Admitting: Advanced Practice Midwife

## 2021-07-29 ENCOUNTER — Encounter: Payer: Self-pay | Admitting: Advanced Practice Midwife

## 2021-07-29 ENCOUNTER — Other Ambulatory Visit: Payer: Self-pay

## 2021-07-29 VITALS — BP 107/70 | HR 69 | Ht 68.0 in | Wt 169.0 lb

## 2021-07-29 DIAGNOSIS — Z3169 Encounter for other general counseling and advice on procreation: Secondary | ICD-10-CM

## 2021-07-29 DIAGNOSIS — Z0000 Encounter for general adult medical examination without abnormal findings: Secondary | ICD-10-CM

## 2021-07-29 DIAGNOSIS — Z01419 Encounter for gynecological examination (general) (routine) without abnormal findings: Secondary | ICD-10-CM | POA: Diagnosis not present

## 2021-07-29 NOTE — Progress Notes (Signed)
   Subjective:     Kathleen Guerra is a 30 y.o. female here at [facility] for a routine exam.  Current complaints: none, menses are regular now. IUD removed 6 months ago and pt desires pregnancy.  Personal health questionnaire reviewed: yes.  Do you have a primary care provider? yes Do you feel safe at home? yes    Health Maintenance Due  Topic Date Due   Hepatitis C Screening  Never done   PAP-Cervical Cytology Screening  Never done   PAP SMEAR-Modifier  Never done   INFLUENZA VACCINE  Never done     Risk factors for chronic health problems: Smoking: Former, cigarettes Alchohol/how much: occasional  Pt BMI: Body mass index is 25.7 kg/m.   Gynecologic History Patient's last menstrual period was 07/14/2021. Contraception: none Last Pap: 2021. Results were: normal per pt Last mammogram: n/a.   Obstetric History OB History  Gravida Para Term Preterm AB Living  _0 SAB IAB Ectopic Multiple Live Births          1    # Outcome Date GA Lbr Len/2nd Weight Sex Delivery Anes PTL Lv  1 Term 10/31/17 [redacted]w[redacted]d  F Vag-Spont   LIV     The following portions of the patient's history were reviewed and updated as appropriate: allergies, current medications, past family history, past medical history, past social history, past surgical history, and problem list.  Review of Systems Pertinent items noted in HPI and remainder of comprehensive ROS otherwise negative.    Objective:   BP 107/70   Pulse 69   Ht _1  (1.727 m)   Wt 169 lb (76.7 kg)   LMP 07/14/2021   BMI 25.70 kg/m  VS reviewed, nursing note reviewed,  Constitutional: well developed, well nourished, no distress HEENT: normocephalic CV: normal rate Pulm/chest wall: normal effort Breast Exam:  Deferred with low risks and shared decision making, discussed recommendation to start mammogram between 40-50 yo/  Abdomen: soft Neuro: alert and oriented x 3 Skin: warm, dry Psych: affect normal Pelvic  exam:Deferred    Assessment/Plan:   1. Well woman exam (no gynecological exam) --Doing well, no gyn concerns --Denies need for STD testing --Pap wnl last year, records requested  2. Encounter for preconception consultation --Discussed lifestyle changes, folic acid/PNV prior to pregnancy. --Pt with regular cycles, likely ovulating --Discussed use of ovulation kit if desired for improved timing --Make appt in 3-6 months if pregnancy not achieved   Follow up in: 1  year  or as needed.   LFatima Blank CNM 9:31 AM

## 2021-08-05 ENCOUNTER — Telehealth: Payer: Self-pay | Admitting: Advanced Practice Midwife

## 2021-08-05 ENCOUNTER — Encounter: Payer: Self-pay | Admitting: Advanced Practice Midwife

## 2021-08-06 NOTE — Telephone Encounter (Signed)
Attempt to call pt to clarify dose of Zoloft and follow up plan with counseling/behavioral health.  Unable to leave message, voicemail not set up.  MyChart message sent.

## 2021-08-12 ENCOUNTER — Other Ambulatory Visit: Payer: Self-pay | Admitting: *Deleted

## 2021-08-12 ENCOUNTER — Telehealth: Payer: Self-pay | Admitting: *Deleted

## 2021-08-12 NOTE — Telephone Encounter (Signed)
Spoke with pt regarding Zoloft request that she had previously called about. Pt did not see Mychart msg from provider.  Medication and dose verified- pt is on Zoloft 200mg  daily. Pt states she does not need any other services at this time, she is doing well with medication.   Pt made aware info will be sent to provider for new Rx.   Please send Rx to [facility] in [place]([address])

## 2021-08-12 NOTE — Progress Notes (Signed)
error 

## 2021-08-16 ENCOUNTER — Other Ambulatory Visit: Payer: Self-pay | Admitting: Advanced Practice Midwife

## 2021-08-16 DIAGNOSIS — F339 Major depressive disorder, recurrent, unspecified: Secondary | ICD-10-CM

## 2021-08-16 MED ORDER — SERTRALINE HCL 100 MG PO TABS: 200.0000 mg | ORAL_TABLET | Freq: Every day | ORAL | 6 refills | Status: AC

## 2021-08-16 NOTE — Progress Notes (Signed)
Per patient, depression is well controlled and stable on 200 mg daily of Zoloft. She does not desire any other services, which were discussed via MyChart and phone.  Rx for Zoloft 200 mg daily renewed x 6 months. Pt to f/u in our office in 6 months for mood check.

## 2021-08-20 ENCOUNTER — Encounter: Payer: Self-pay | Admitting: *Deleted

## 2021-09-20 ENCOUNTER — Ambulatory Visit (INDEPENDENT_AMBULATORY_CARE_PROVIDER_SITE_OTHER): Payer: Medicaid Other

## 2021-09-20 ENCOUNTER — Other Ambulatory Visit: Payer: Self-pay | Admitting: *Deleted

## 2021-09-20 ENCOUNTER — Other Ambulatory Visit: Payer: Self-pay

## 2021-09-20 DIAGNOSIS — Z3201 Encounter for pregnancy test, result positive: Secondary | ICD-10-CM

## 2021-09-20 DIAGNOSIS — Z3480 Encounter for supervision of other normal pregnancy, unspecified trimester: Secondary | ICD-10-CM

## 2021-09-20 DIAGNOSIS — Z3200 Encounter for pregnancy test, result unknown: Secondary | ICD-10-CM | POA: Diagnosis not present

## 2021-09-20 LAB — POCT URINE PREGNANCY: Preg Test, Ur: POSITIVE — AB

## 2021-09-20 NOTE — Progress Notes (Signed)
..  Ms. Volante presents today for UPT. She has no unusual complaints. LMP:08-13-2021    OBJECTIVE: Appears well, in no apparent distress.  OB History     Gravida  2   Para  1   Term  1   Preterm      AB      Living  1      SAB      IAB      Ectopic      Multiple      Live Births  1          Home UPT Result:Positive In-Office UPT result:Positive I have reviewed the patient's medical, obstetrical, social, and family histories, and medications.   ASSESSMENT: Positive pregnancy test  PLAN Prenatal care to be completed at: [facility]

## 2021-10-15 ENCOUNTER — Encounter: Payer: Self-pay | Admitting: *Deleted

## 2021-10-21 ENCOUNTER — Ambulatory Visit (INDEPENDENT_AMBULATORY_CARE_PROVIDER_SITE_OTHER): Payer: Medicaid Other

## 2021-10-21 ENCOUNTER — Other Ambulatory Visit: Payer: Self-pay

## 2021-10-21 VITALS — BP 117/69 | HR 75 | Ht 68.0 in | Wt 158.5 lb

## 2021-10-21 DIAGNOSIS — Z3481 Encounter for supervision of other normal pregnancy, first trimester: Secondary | ICD-10-CM

## 2021-10-21 DIAGNOSIS — Z3490 Encounter for supervision of normal pregnancy, unspecified, unspecified trimester: Secondary | ICD-10-CM | POA: Insufficient documentation

## 2021-10-21 IMAGING — US US OB LIMITED
1 series · 11 of 11 positions shown · non-contrast
Comparison: none

[Series 1: us ob limited · 11 of 11 slices shown]
[im 1/11]
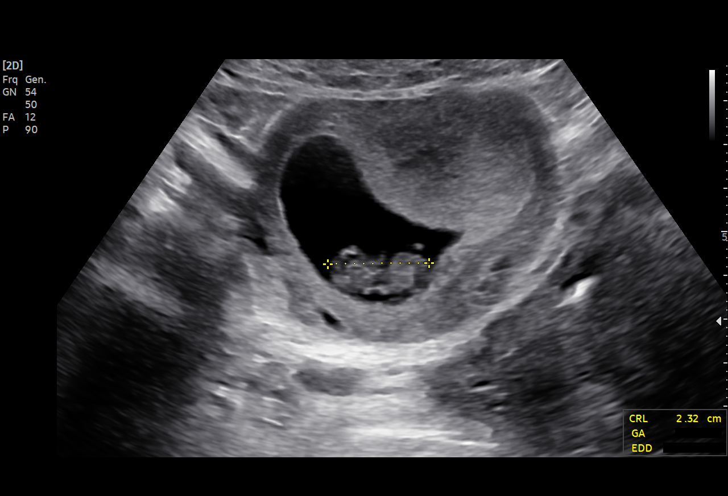
[im 2/11]
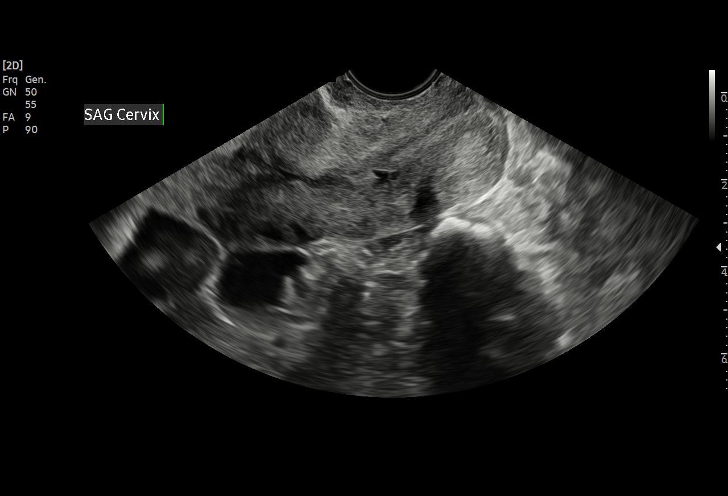
[im 3/11]
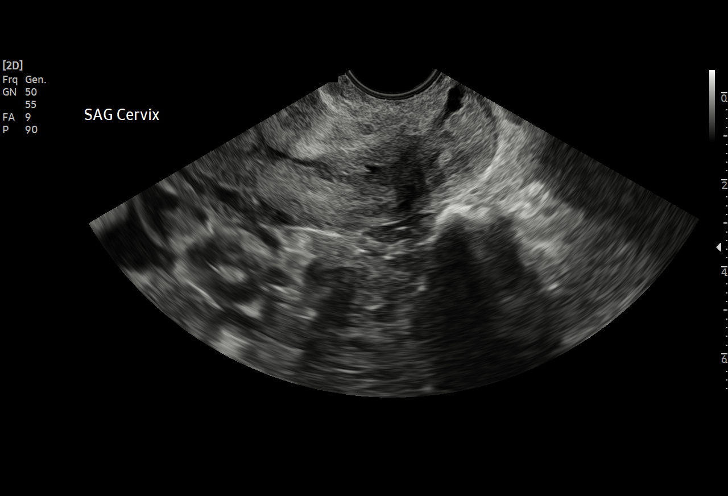
[im 4/11]
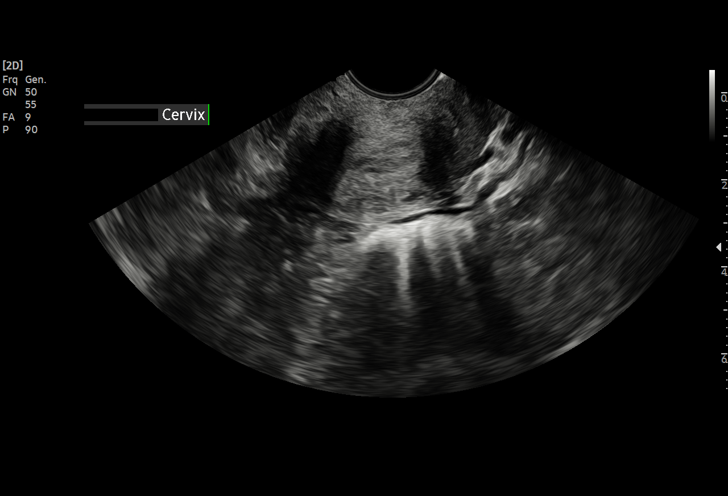
[im 5/11]
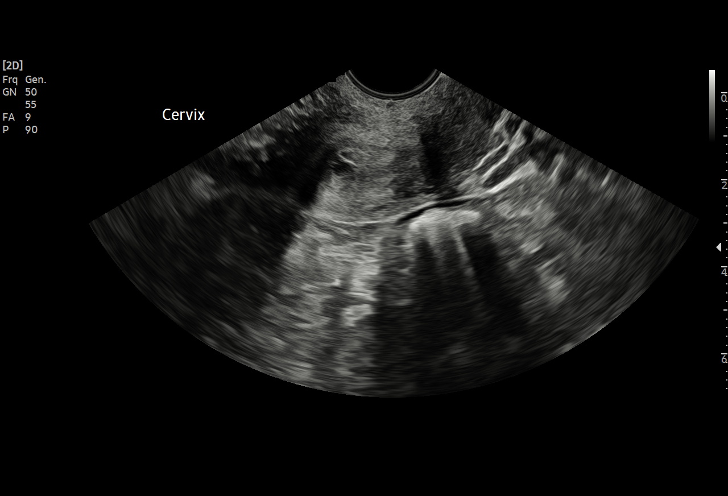
[im 6/11]
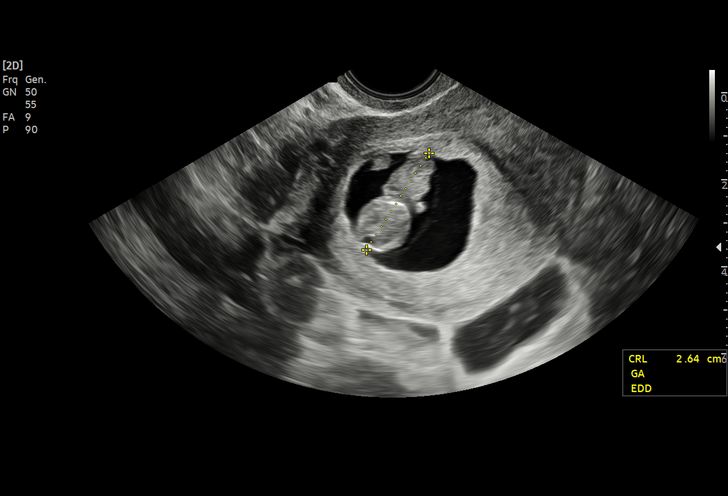
[im 7/11]
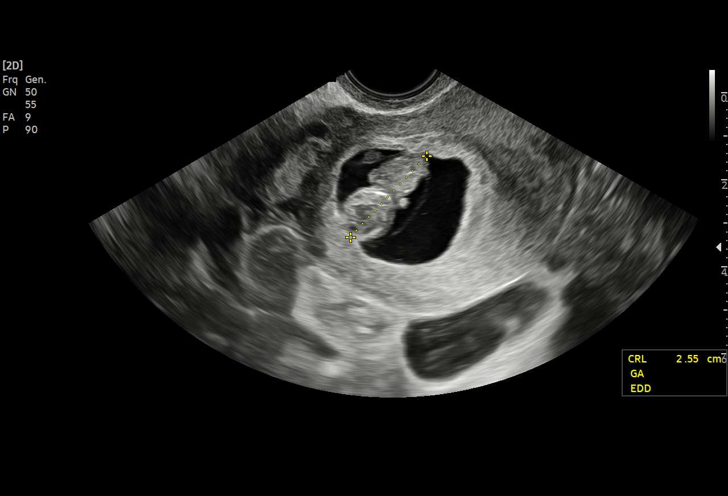
[im 8/11]
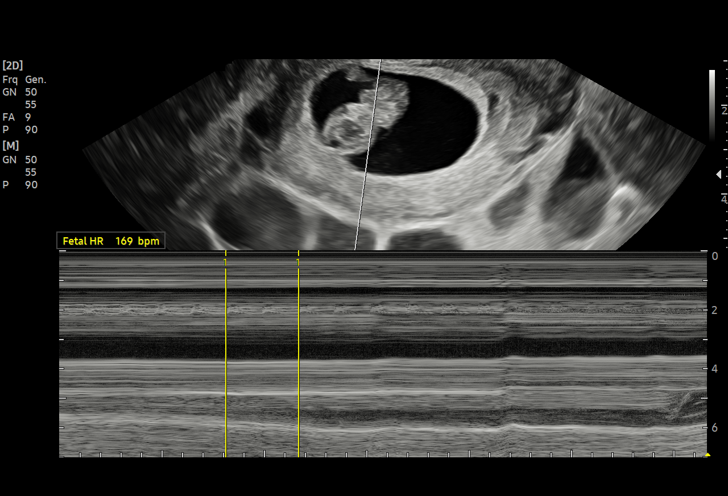
[im 9/11]
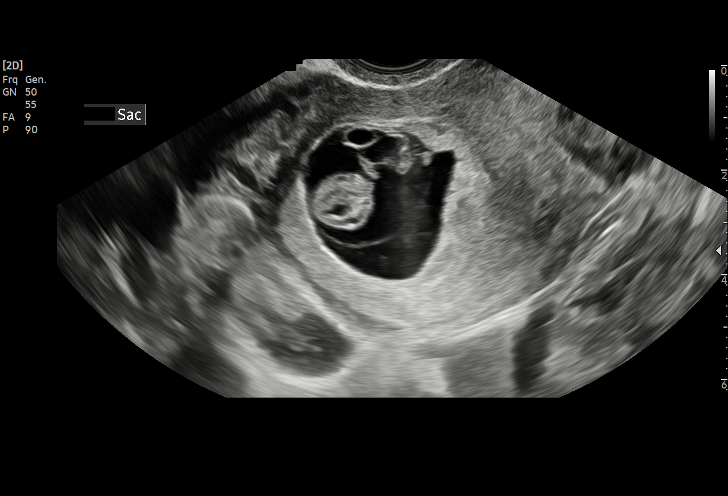
[im 10/11]
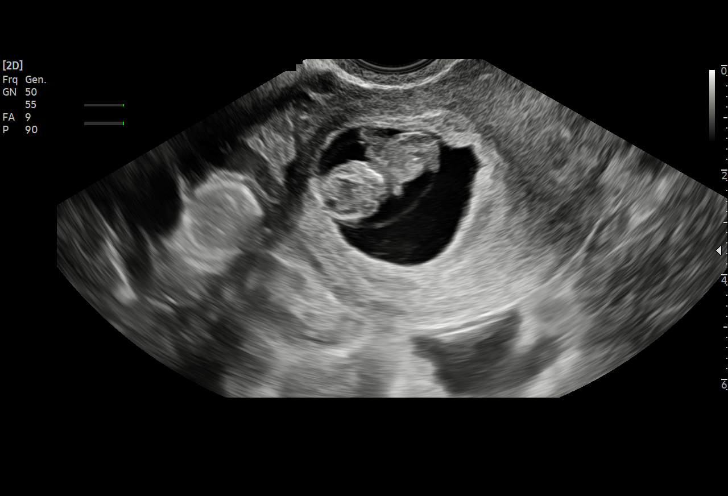
[im 11/11]
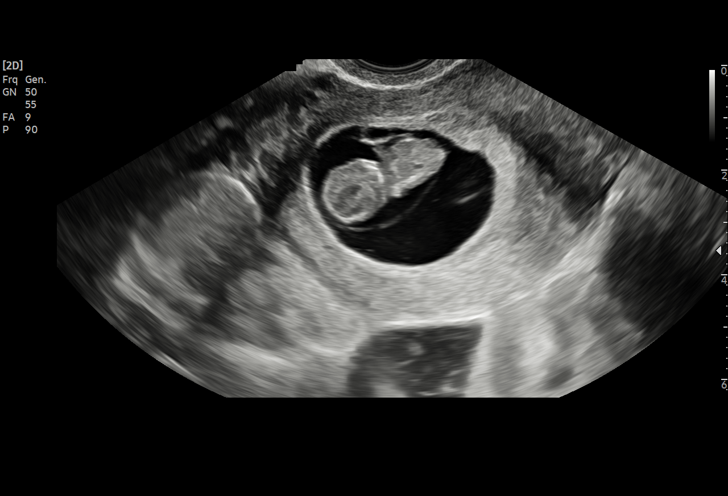

[11 of 11 positions shown; findings below may reference images not displayed]

[REDACTED]care

 1  [HOSPITAL]                         76815.0     FAIZI

Indications

 9 weeks gestation of pregnancy
Fetal Evaluation

 Num Of Fetuses:         1
 Fetal Heart Rate(bpm):  169
 Cardiac Activity:       Observed
Biometry

 CRL:        25  mm     G. Age:  9w 1d                   EDD:   [DATE]
Gestational Age

 Best:          9w 1d      Det. By:  U/S C R L ([DATE])     EDD:   [DATE]
Comments

 Single live IUP at 9w1d by CRL. Provided LMP [DATE].
Impression

 Viable intrauterine pregnancy
Recommendations

 Routine prenatal care
               FAIZI

## 2021-10-21 NOTE — Progress Notes (Signed)
New OB Intake  I connected with  Kathleen Guerra on 10/21/21 at  3:30 PM EST by in person and verified that I am speaking with the correct person using two identifiers. Nurse is located at [facility] and pt is located at [place].  I discussed the limitations, risks, security and privacy concerns of performing an evaluation and management service by telephone and the availability of in person appointments. I also discussed with the patient that there may be a patient responsible charge related to this service. The patient expressed understanding and agreed to proceed.  I explained I am completing New OB Intake today. We discussed her EDD of 05/20/22 that is based on LMP of 08/13/21. Pt is G2/P1001. I reviewed her allergies, medications, Medical/Surgical/OB history, and appropriate screenings. I informed her of [facility] services. Based on history, this is a/an  pregnancy uncomplicated .   Patient Active Problem List   Diagnosis Date Noted   Gestational hypertension 10/29/2017    Concerns addressed today  Delivery Plans:  Plans to deliver at [facility].   MyChart/Babyscripts MyChart access verified. I explained pt will have some visits in office and some virtually. Babyscripts instructions given and order placed. Patient verifies receipt of registration text/e-mail. Account successfully created and app downloaded.  Blood Pressure Cuff  Patient has access to a BP cuff at home.  Weight scale: Patient does not  have weight scale. Patient states that she will buy one.  Anatomy US Explained first scheduled [place] will be around 19 weeks. Dating and viability scan performed today.  Labs Discussed Avelina Laine genetic screening with patient. Would like both Panorama and Horizon drawn at new OB visit.Also if interested in genetic testing, tell patient she will need AFP 15-21 weeks to complete genetic testing .Routine prenatal labs needed.  Covid Vaccine Patient has not covid vaccine.    Informed patient of  [facility] website  and placed link in her AVS.   Social Determinants of Health Food Insecurity: Patient denies food insecurity. WIC Referral: Patient is [facility].  Transportation: Patient denies transportation needs. Childcare: Discussed no children allowed at ultrasound appointments. Offered childcare services; patient declines childcare services at this time.  Send link to Pregnancy Navigators   Placed OB Box on problem list and updated  First visit review I reviewed new OB appt with pt. I explained she will have a pelvic exam, ob bloodwork with genetic screening, and PAP smear. Explained pt will be seen by Peggy Constant at first visit; encounter routed to appropriate provider. Explained that patient will be seen by pregnancy navigator following visit with provider. [facility] information placed in AVS.   Hamilton Capri, RN 10/21/2021  3:07 PM

## 2021-10-24 ENCOUNTER — Other Ambulatory Visit: Payer: Self-pay | Admitting: *Deleted

## 2021-10-24 DIAGNOSIS — Z3480 Encounter for supervision of other normal pregnancy, unspecified trimester: Secondary | ICD-10-CM

## 2021-10-24 NOTE — Progress Notes (Signed)
Corrections made to u/s order per radiology request.  ?

## 2021-11-04 ENCOUNTER — Other Ambulatory Visit: Payer: Self-pay

## 2021-11-04 ENCOUNTER — Other Ambulatory Visit (HOSPITAL_COMMUNITY)
Admission: RE | Admit: 2021-11-04 | Discharge: 2021-11-04 | Disposition: A | Discharge status: Home / Self Care | Payer: Medicaid Other | Source: Ambulatory Visit | Attending: Obstetrics and Gynecology | Admitting: Obstetrics and Gynecology

## 2021-11-04 ENCOUNTER — Ambulatory Visit (INDEPENDENT_AMBULATORY_CARE_PROVIDER_SITE_OTHER): Payer: Medicaid Other | Admitting: Obstetrics and Gynecology

## 2021-11-04 ENCOUNTER — Encounter: Payer: Self-pay | Admitting: Obstetrics and Gynecology

## 2021-11-04 DIAGNOSIS — Z3481 Encounter for supervision of other normal pregnancy, first trimester: Secondary | ICD-10-CM

## 2021-11-04 DIAGNOSIS — Z3A11 11 weeks gestation of pregnancy: Secondary | ICD-10-CM

## 2021-11-04 DIAGNOSIS — Z8759 Personal history of other complications of pregnancy, childbirth and the puerperium: Secondary | ICD-10-CM

## 2021-11-04 DIAGNOSIS — Z3480 Encounter for supervision of other normal pregnancy, unspecified trimester: Secondary | ICD-10-CM | POA: Diagnosis present

## 2021-11-04 MED ORDER — VITAFOL ULTRA 29-0.6-0.4-200 MG PO CAPS: 1.0000 | ORAL_CAPSULE | Freq: Every day | ORAL | 12 refills | Status: AC

## 2021-11-04 MED ORDER — ASPIRIN EC 81 MG PO TBEC: 81.0000 mg | DELAYED_RELEASE_TABLET | Freq: Every day | ORAL | 2 refills | Status: AC

## 2021-11-04 NOTE — Progress Notes (Signed)
?  Subjective:  ? ? Kathleen Guerra is a G2P1001 [redacted]w[redacted]d being seen today for her first obstetrical visit.  Her obstetrical history is significant for history of gestational hypertension at term. Patient does intend to breast feed. Pregnancy history fully reviewed. ? ?Patient reports constipation. ? ?Vitals:  ? 11/04/21 0942  ?BP: 121/77  ?Pulse: 69  ?Weight: 153 lb 14.4 oz (69.8 kg)  ? ? ?HISTORY: ?OB History  ?Gravida Para Term Preterm AB Living  ?2 1 1     1   ?SAB IAB Ectopic Multiple Live Births  ?        1  ?  ?# Outcome Date GA Lbr Len/2nd Weight Sex Delivery Anes PTL Lv  ?2 Current           ?1 Term 10/31/17 [redacted]w[redacted]d   F Vag-Spont   LIV  ? ?Past Medical History:  ?Diagnosis Date  ?? Anxiety   ?? Depression   ? ?Past Surgical History:  ?Procedure Laterality Date  ?? EYE SURGERY    ?? TONSILLECTOMY    ? ?Family History  ?Problem Relation Age of Onset  ?? Clotting disorder Father   ?? Cancer Maternal Grandmother   ? ? ? ?Exam  ? ? ?Uterus:     ?Pelvic Exam:   ? Perineum: No Hemorrhoids, Normal Perineum  ? Vulva: normal  ? Vagina:  normal mucosa, normal discharge  ? pH:   ? Cervix: multiparous appearance and closed and long  ? Adnexa: no mass, fullness, tenderness  ? Bony Pelvis: gynecoid  ?System: Breast:  normal appearance, no masses or tenderness  ? Skin: normal coloration and turgor, no rashes ?  ? Neurologic: oriented, no focal deficits  ? Extremities: normal strength, tone, and muscle mass  ? HEENT extra ocular movement intact  ? Mouth/Teeth mucous membranes moist, pharynx normal without lesions and dental hygiene good  ? Neck supple and no masses  ? Cardiovascular: regular rate and rhythm  ? Respiratory:  appears well, vitals normal, no respiratory distress, acyanotic, normal RR, chest clear, no wheezing, crepitations, rhonchi, normal symmetric air entry  ? Abdomen: soft, non-tender; bowel sounds normal; no masses,  no organomegaly  ? Urinary:   ? ? ?  ?Assessment:  ? ? Pregnancy: G2P1001 ?Patient Active  Problem List  ? Diagnosis Date Noted  ?? History of gestational hypertension 11/04/2021  ?? Supervision of normal pregnancy 10/21/2021  ?? Gestational hypertension 10/29/2017  ? ?  ? ?  ?Plan:  ? ?  ?Initial labs drawn. ?Prenatal vitamins. ?Problem list reviewed and updated. ?Genetic Screening discussed : panorama ordered. ? Ultrasound discussed; fetal survey: ordered. ?Rx ASA provided ?Discussed increasing hydration to help with constipation as patient is already taking a stool softener and supplemental fiber ? Follow up in 4 weeks. ?50% of 30 min visit spent on counseling and coordination of care.  ?  ? ?Genowefa Morga ?11/04/2021 ? ? ?

## 2021-11-04 NOTE — Progress Notes (Signed)
Pt presents for NOB without complaints. ?NOB intake with u/s completed 10/21/21 ?Anatomy scheduled 12/24/21  ? ?

## 2021-11-04 NOTE — Patient Instructions (Signed)

## 2021-11-05 ENCOUNTER — Encounter: Payer: Self-pay | Admitting: Obstetrics and Gynecology

## 2021-11-05 ENCOUNTER — Other Ambulatory Visit: Payer: Self-pay | Admitting: *Deleted

## 2021-11-05 LAB — CBC/D/PLT+RPR+RH+ABO+RUBIGG...
Antibody Screen: NEGATIVE
Basophils Absolute: 0 10*3/uL (ref 0.0–0.2)
Basos: 0 %
EOS (ABSOLUTE): 0.1 10*3/uL (ref 0.0–0.4)
Eos: 1 %
HCV Ab: NONREACTIVE
HIV Screen 4th Generation wRfx: NONREACTIVE
Hematocrit: 36.3 % (ref 34.0–46.6)
Hemoglobin: 12.7 g/dL (ref 11.1–15.9)
Hepatitis B Surface Ag: NEGATIVE
Immature Grans (Abs): 0 10*3/uL (ref 0.0–0.1)
Immature Granulocytes: 0 %
Lymphocytes Absolute: 0.9 10*3/uL (ref 0.7–3.1)
Lymphs: 16 %
MCH: 30.2 pg (ref 26.6–33.0)
MCHC: 35 g/dL (ref 31.5–35.7)
MCV: 86 fL (ref 79–97)
Monocytes Absolute: 0.5 10*3/uL (ref 0.1–0.9)
Monocytes: 8 %
Neutrophils Absolute: 4.2 10*3/uL (ref 1.4–7.0)
Neutrophils: 75 %
Platelets: 201 10*3/uL (ref 150–450)
RBC: 4.2 x10E6/uL (ref 3.77–5.28)
RDW: 12.1 % (ref 11.7–15.4)
RPR Ser Ql: NONREACTIVE
Rh Factor: POSITIVE
Rubella Antibodies, IGG: 2.12 index (ref >0.99)
WBC: 5.6 10*3/uL (ref 3.4–10.8)

## 2021-11-05 LAB — CYTOLOGY - PAP: Diagnosis: NEGATIVE

## 2021-11-05 LAB — CERVICOVAGINAL ANCILLARY ONLY
Chlamydia: NEGATIVE
Comment: NEGATIVE
Comment: NEGATIVE
Comment: NORMAL
Neisseria Gonorrhea: NEGATIVE
Trichomonas: NEGATIVE

## 2021-11-05 LAB — HCV INTERPRETATION

## 2021-11-05 MED ORDER — DICLEGIS 10-10 MG PO TBEC: DELAYED_RELEASE_TABLET | ORAL | 3 refills | Status: AC

## 2021-11-05 NOTE — Progress Notes (Signed)
Pt request Rx for nausea, Diclegis sent today.  ?

## 2021-11-06 LAB — URINE CULTURE, OB REFLEX

## 2021-11-06 LAB — CULTURE, OB URINE

## 2021-11-11 ENCOUNTER — Encounter: Payer: Self-pay | Admitting: Obstetrics and Gynecology

## 2021-11-13 ENCOUNTER — Encounter: Payer: Self-pay | Admitting: Obstetrics and Gynecology

## 2021-11-22 ENCOUNTER — Ambulatory Visit (INDEPENDENT_AMBULATORY_CARE_PROVIDER_SITE_OTHER): Payer: Medicaid Other

## 2021-11-22 DIAGNOSIS — R350 Frequency of micturition: Secondary | ICD-10-CM | POA: Diagnosis not present

## 2021-11-22 LAB — POCT URINALYSIS DIPSTICK
Bilirubin, UA: NEGATIVE
Blood, UA: NEGATIVE
Glucose, UA: NEGATIVE
Ketones, UA: NEGATIVE
Nitrite, UA: NEGATIVE
Protein, UA: NEGATIVE
Spec Grav, UA: 1.01 (ref 1.010–1.025)
Urobilinogen, UA: 0.2 E.U./dL
pH, UA: 6.5 (ref 5.0–8.0)

## 2021-11-22 NOTE — Progress Notes (Addendum)
..  SUBJECTIVE: Kathleen Guerra is a 31 y.o. female who complains of urinary frequency, urgency and dysuria x 2-3 days, without flank pain, fever, chills, or abnormal vaginal discharge or bleeding.  ? ?OBJECTIVE: Appears well, in no apparent distress.  Urine dipstick shows positive for leukocytes.   ? ?ASSESSMENT: Dysuria ? ?PLAN: Treatment per orders.  Call or return to clinic prn if these symptoms worsen or fail to improve as anticipated. ?Urine sent for culture ? ? ?I reviewed the patient history and course with the nurse. I agree with the documentation and plan as noted.  ? ?Milas Hock, MD ? ?

## 2021-11-24 LAB — CULTURE, OB URINE

## 2021-11-24 LAB — URINE CULTURE, OB REFLEX

## 2021-12-02 DIAGNOSIS — R000 Tachycardia, unspecified: Secondary | ICD-10-CM | POA: Diagnosis not present

## 2021-12-02 DIAGNOSIS — Z3682 Encounter for antenatal screening for nuchal translucency: Secondary | ICD-10-CM | POA: Diagnosis not present

## 2021-12-02 DIAGNOSIS — Z1379 Encounter for other screening for genetic and chromosomal anomalies: Secondary | ICD-10-CM | POA: Diagnosis not present

## 2021-12-03 ENCOUNTER — Encounter: Payer: Medicaid Other | Admitting: Medical

## 2021-12-03 DIAGNOSIS — Z3491 Encounter for supervision of normal pregnancy, unspecified, first trimester: Secondary | ICD-10-CM | POA: Diagnosis not present

## 2021-12-10 ENCOUNTER — Encounter: Payer: Medicaid Other | Admitting: Obstetrics

## 2021-12-11 DIAGNOSIS — R002 Palpitations: Secondary | ICD-10-CM | POA: Diagnosis not present

## 2021-12-11 DIAGNOSIS — R000 Tachycardia, unspecified: Secondary | ICD-10-CM | POA: Diagnosis not present

## 2021-12-12 DIAGNOSIS — R000 Tachycardia, unspecified: Secondary | ICD-10-CM | POA: Diagnosis not present

## 2021-12-23 DIAGNOSIS — O3680X1 Pregnancy with inconclusive fetal viability, fetus 1: Secondary | ICD-10-CM | POA: Diagnosis not present

## 2021-12-23 DIAGNOSIS — Z3492 Encounter for supervision of normal pregnancy, unspecified, second trimester: Secondary | ICD-10-CM | POA: Diagnosis not present

## 2021-12-24 ENCOUNTER — Ambulatory Visit: Payer: Medicaid Other | Attending: Obstetrics & Gynecology

## 2022-05-12 ENCOUNTER — Other Ambulatory Visit: Payer: Self-pay | Admitting: Advanced Practice Midwife

## 2022-05-12 DIAGNOSIS — F339 Major depressive disorder, recurrent, unspecified: Secondary | ICD-10-CM
